# Patient Record
Sex: Female | Born: 1940
Health system: Southern US, Community
[De-identification: ages and names within clinical notes are randomized; demographics above are authoritative.]

## PROBLEM LIST (undated history)

## (undated) DIAGNOSIS — I10 Essential (primary) hypertension: Secondary | ICD-10-CM

## (undated) DIAGNOSIS — I340 Nonrheumatic mitral (valve) insufficiency: Secondary | ICD-10-CM

## (undated) DIAGNOSIS — I509 Heart failure, unspecified: Secondary | ICD-10-CM

## (undated) DIAGNOSIS — I5020 Unspecified systolic (congestive) heart failure: Secondary | ICD-10-CM

## (undated) DIAGNOSIS — N189 Chronic kidney disease, unspecified: Secondary | ICD-10-CM

## (undated) HISTORY — DX: Unspecified systolic (congestive) heart failure: I50.20

## (undated) HISTORY — DX: Chronic kidney disease, unspecified: N18.9

## (undated) HISTORY — DX: Nonrheumatic mitral (valve) insufficiency: I34.0

---

## 2014-06-06 DIAGNOSIS — I429 Cardiomyopathy, unspecified: Secondary | ICD-10-CM | POA: Diagnosis not present

## 2014-06-06 DIAGNOSIS — I341 Nonrheumatic mitral (valve) prolapse: Secondary | ICD-10-CM | POA: Diagnosis not present

## 2014-06-06 DIAGNOSIS — N189 Chronic kidney disease, unspecified: Secondary | ICD-10-CM | POA: Diagnosis not present

## 2014-06-06 DIAGNOSIS — I340 Nonrheumatic mitral (valve) insufficiency: Secondary | ICD-10-CM | POA: Diagnosis not present

## 2014-06-06 DIAGNOSIS — I10 Essential (primary) hypertension: Secondary | ICD-10-CM | POA: Diagnosis not present

## 2014-06-06 DIAGNOSIS — I509 Heart failure, unspecified: Secondary | ICD-10-CM | POA: Diagnosis not present

## 2014-06-09 DIAGNOSIS — R945 Abnormal results of liver function studies: Secondary | ICD-10-CM | POA: Diagnosis not present

## 2014-06-09 DIAGNOSIS — D631 Anemia in chronic kidney disease: Secondary | ICD-10-CM | POA: Diagnosis not present

## 2014-06-09 DIAGNOSIS — I5022 Chronic systolic (congestive) heart failure: Secondary | ICD-10-CM | POA: Diagnosis not present

## 2014-06-09 DIAGNOSIS — I509 Heart failure, unspecified: Secondary | ICD-10-CM | POA: Diagnosis not present

## 2014-06-09 DIAGNOSIS — D500 Iron deficiency anemia secondary to blood loss (chronic): Secondary | ICD-10-CM | POA: Diagnosis not present

## 2014-06-09 DIAGNOSIS — J340 Abscess, furuncle and carbuncle of nose: Secondary | ICD-10-CM | POA: Diagnosis not present

## 2014-06-09 DIAGNOSIS — E878 Other disorders of electrolyte and fluid balance, not elsewhere classified: Secondary | ICD-10-CM | POA: Diagnosis not present

## 2014-07-18 DIAGNOSIS — N189 Chronic kidney disease, unspecified: Secondary | ICD-10-CM | POA: Diagnosis not present

## 2014-07-18 DIAGNOSIS — I429 Cardiomyopathy, unspecified: Secondary | ICD-10-CM | POA: Diagnosis not present

## 2014-07-18 DIAGNOSIS — D649 Anemia, unspecified: Secondary | ICD-10-CM | POA: Diagnosis not present

## 2014-07-18 DIAGNOSIS — I10 Essential (primary) hypertension: Secondary | ICD-10-CM | POA: Diagnosis not present

## 2014-07-18 DIAGNOSIS — I341 Nonrheumatic mitral (valve) prolapse: Secondary | ICD-10-CM | POA: Diagnosis not present

## 2014-07-21 DIAGNOSIS — I340 Nonrheumatic mitral (valve) insufficiency: Secondary | ICD-10-CM | POA: Diagnosis not present

## 2014-07-21 DIAGNOSIS — D6489 Other specified anemias: Secondary | ICD-10-CM | POA: Diagnosis not present

## 2014-07-21 DIAGNOSIS — I5022 Chronic systolic (congestive) heart failure: Secondary | ICD-10-CM | POA: Diagnosis not present

## 2014-07-21 DIAGNOSIS — R7889 Finding of other specified substances, not normally found in blood: Secondary | ICD-10-CM | POA: Diagnosis not present

## 2014-07-21 DIAGNOSIS — N183 Chronic kidney disease, stage 3 (moderate): Secondary | ICD-10-CM | POA: Diagnosis not present

## 2014-07-21 DIAGNOSIS — E46 Unspecified protein-calorie malnutrition: Secondary | ICD-10-CM | POA: Diagnosis not present

## 2014-07-21 DIAGNOSIS — R6889 Other general symptoms and signs: Secondary | ICD-10-CM | POA: Diagnosis not present

## 2014-10-17 DIAGNOSIS — I10 Essential (primary) hypertension: Secondary | ICD-10-CM | POA: Diagnosis not present

## 2014-10-17 DIAGNOSIS — I429 Cardiomyopathy, unspecified: Secondary | ICD-10-CM | POA: Diagnosis not present

## 2014-10-17 DIAGNOSIS — N189 Chronic kidney disease, unspecified: Secondary | ICD-10-CM | POA: Diagnosis not present

## 2014-10-17 DIAGNOSIS — I341 Nonrheumatic mitral (valve) prolapse: Secondary | ICD-10-CM | POA: Diagnosis not present

## 2014-10-27 DIAGNOSIS — I5022 Chronic systolic (congestive) heart failure: Secondary | ICD-10-CM | POA: Diagnosis not present

## 2014-10-27 DIAGNOSIS — E878 Other disorders of electrolyte and fluid balance, not elsewhere classified: Secondary | ICD-10-CM | POA: Diagnosis not present

## 2014-10-27 DIAGNOSIS — D6489 Other specified anemias: Secondary | ICD-10-CM | POA: Diagnosis not present

## 2014-10-27 DIAGNOSIS — I10 Essential (primary) hypertension: Secondary | ICD-10-CM | POA: Diagnosis not present

## 2014-10-27 DIAGNOSIS — D649 Anemia, unspecified: Secondary | ICD-10-CM | POA: Diagnosis not present

## 2014-10-27 DIAGNOSIS — E079 Disorder of thyroid, unspecified: Secondary | ICD-10-CM | POA: Diagnosis not present

## 2014-10-27 DIAGNOSIS — E785 Hyperlipidemia, unspecified: Secondary | ICD-10-CM | POA: Diagnosis not present

## 2014-10-27 DIAGNOSIS — I340 Nonrheumatic mitral (valve) insufficiency: Secondary | ICD-10-CM | POA: Diagnosis not present

## 2014-10-27 DIAGNOSIS — R945 Abnormal results of liver function studies: Secondary | ICD-10-CM | POA: Diagnosis not present

## 2014-11-29 DIAGNOSIS — I340 Nonrheumatic mitral (valve) insufficiency: Secondary | ICD-10-CM | POA: Diagnosis not present

## 2014-11-29 DIAGNOSIS — R945 Abnormal results of liver function studies: Secondary | ICD-10-CM | POA: Diagnosis not present

## 2014-11-29 DIAGNOSIS — D649 Anemia, unspecified: Secondary | ICD-10-CM | POA: Diagnosis not present

## 2014-11-29 DIAGNOSIS — I5022 Chronic systolic (congestive) heart failure: Secondary | ICD-10-CM | POA: Diagnosis not present

## 2015-10-20 DIAGNOSIS — I509 Heart failure, unspecified: Secondary | ICD-10-CM | POA: Diagnosis not present

## 2015-10-20 DIAGNOSIS — I10 Essential (primary) hypertension: Secondary | ICD-10-CM | POA: Diagnosis not present

## 2015-10-20 DIAGNOSIS — R011 Cardiac murmur, unspecified: Secondary | ICD-10-CM | POA: Diagnosis not present

## 2015-10-22 ENCOUNTER — Emergency Department (HOSPITAL_COMMUNITY)
Admission: EM | Admit: 2015-10-22 | Discharge: 2015-10-22 | Disposition: A | Discharge status: Home / Self Care | Payer: Medicare Other | Attending: Emergency Medicine | Admitting: Emergency Medicine

## 2015-10-22 ENCOUNTER — Encounter (HOSPITAL_COMMUNITY): Payer: Self-pay | Admitting: Emergency Medicine

## 2015-10-22 DIAGNOSIS — I509 Heart failure, unspecified: Secondary | ICD-10-CM | POA: Insufficient documentation

## 2015-10-22 DIAGNOSIS — N189 Chronic kidney disease, unspecified: Secondary | ICD-10-CM | POA: Diagnosis not present

## 2015-10-22 DIAGNOSIS — I130 Hypertensive heart and chronic kidney disease with heart failure and stage 1 through stage 4 chronic kidney disease, or unspecified chronic kidney disease: Secondary | ICD-10-CM | POA: Insufficient documentation

## 2015-10-22 DIAGNOSIS — R944 Abnormal results of kidney function studies: Secondary | ICD-10-CM | POA: Diagnosis present

## 2015-10-22 DIAGNOSIS — Z79899 Other long term (current) drug therapy: Secondary | ICD-10-CM | POA: Insufficient documentation

## 2015-10-22 DIAGNOSIS — I129 Hypertensive chronic kidney disease with stage 1 through stage 4 chronic kidney disease, or unspecified chronic kidney disease: Secondary | ICD-10-CM | POA: Diagnosis not present

## 2015-10-22 HISTORY — DX: Essential (primary) hypertension: I10

## 2015-10-22 HISTORY — DX: Heart failure, unspecified: I50.9

## 2015-10-22 LAB — BASIC METABOLIC PANEL
ANION GAP: 8 (ref 5–15)
BUN: 69 mg/dL — AB (ref 6–20)
CALCIUM: 9.4 mg/dL (ref 8.9–10.3)
CO2: 23 mmol/L (ref 22–32)
Chloride: 107 mmol/L (ref 101–111)
Creatinine, Ser: 2.95 mg/dL — ABNORMAL HIGH (ref 0.44–1.00)
GFR calc Af Amer: 17 mL/min — ABNORMAL LOW (ref >60)
GFR calc non Af Amer: 14 mL/min — ABNORMAL LOW (ref >60)
Glucose, Bld: 101 mg/dL — ABNORMAL HIGH (ref 65–99)
POTASSIUM: 4.9 mmol/L (ref 3.5–5.1)
SODIUM: 138 mmol/L (ref 135–145)

## 2015-10-22 LAB — CBC WITH DIFFERENTIAL/PLATELET
BASOS ABS: 0 10*3/uL (ref 0.0–0.1)
Basophils Relative: 1 %
EOS ABS: 0.1 10*3/uL (ref 0.0–0.7)
EOS PCT: 3 %
HCT: 26.7 % — ABNORMAL LOW (ref 36.0–46.0)
Hemoglobin: 8.7 g/dL — ABNORMAL LOW (ref 12.0–15.0)
Lymphocytes Relative: 41 %
Lymphs Abs: 2.2 10*3/uL (ref 0.7–4.0)
MCH: 28.6 pg (ref 26.0–34.0)
MCHC: 32.6 g/dL (ref 30.0–36.0)
MCV: 87.8 fL (ref 78.0–100.0)
Monocytes Absolute: 0.3 10*3/uL (ref 0.1–1.0)
Monocytes Relative: 5 %
Neutro Abs: 2.7 10*3/uL (ref 1.7–7.7)
Neutrophils Relative %: 50 %
PLATELETS: 181 10*3/uL (ref 150–400)
RBC: 3.04 MIL/uL — AB (ref 3.87–5.11)
RDW: 14.4 % (ref 11.5–15.5)
WBC: 5.3 10*3/uL (ref 4.0–10.5)

## 2015-10-22 LAB — I-STAT CHEM 8, ED
BUN: 66 mg/dL — ABNORMAL HIGH (ref 6–20)
CALCIUM ION: 1.17 mmol/L (ref 1.13–1.30)
CHLORIDE: 105 mmol/L (ref 101–111)
Creatinine, Ser: 3 mg/dL — ABNORMAL HIGH (ref 0.44–1.00)
Glucose, Bld: 100 mg/dL — ABNORMAL HIGH (ref 65–99)
HCT: 28 % — ABNORMAL LOW (ref 36.0–46.0)
HEMOGLOBIN: 9.5 g/dL — AB (ref 12.0–15.0)
Potassium: 4.8 mmol/L (ref 3.5–5.1)
SODIUM: 140 mmol/L (ref 135–145)
TCO2: 21 mmol/L (ref 0–100)

## 2015-10-22 NOTE — ED Notes (Signed)
Pt states that she went to the MD on Friday and had lab work drawn. Labs came back today and stated that her creatinine and potassium were high today and she needed to come in for evaluation. Alert and oriented.

## 2015-10-22 NOTE — Discharge Instructions (Signed)
Chronic Kidney Disease Chronic kidney disease occurs when the kidneys are damaged over a long period. The kidneys are two organs that lie on either side of the spine between the middle of the back and the front of the abdomen. The kidneys:  Remove wastes and extra water from the blood.  Produce important hormones. These help keep bones strong, regulate blood pressure, and help create red blood cells.  Balance the fluids and chemicals in the blood and tissues. A small amount of kidney damage may not cause problems, but a large amount of damage may make it difficult or impossible for the kidneys to work the way they should. If steps are not taken to slow down the kidney damage or stop it from getting worse, the kidneys may stop working permanently. Most of the time, chronic kidney disease does not go away. However, it can often be controlled, and those with the disease can usually live normal lives. CAUSES The most common causes of chronic kidney disease are diabetes and high blood pressure (hypertension). Chronic kidney disease may also be caused by:  Diseases that cause the kidneys' filters to become inflamed.  Diseases that affect the immune system.  Genetic diseases.  Medicines that damage the kidneys, such as anti-inflammatory medicines.  Poisoning or exposure to toxic substances.  A reoccurring kidney or urinary infection.  A problem with urine flow. This may be caused by:  Cancer.  Kidney stones.  An enlarged prostate in males. SIGNS AND SYMPTOMS Because the kidney damage in chronic kidney disease occurs slowly, symptoms develop slowly and may not be obvious until the kidney damage becomes severe. A person may have a kidney disease for years without showing any symptoms. Symptoms can include:  Swelling (edema) of the legs, ankles, or feet.  Tiredness (lethargy).  Nausea or vomiting.  Confusion.  Problems with urination, such as:  Decreased urine  production.  Frequent urination, especially at night.  Frequent accidents in children who are potty trained.  Muscle twitches and cramps.  Shortness of breath.  Weakness.  Persistent itchiness.  Loss of appetite.  Metallic taste in the mouth.  Trouble sleeping.  Slowed development in children.  Short stature in children. DIAGNOSIS Chronic kidney disease may be detected and diagnosed by tests, including blood, urine, imaging, or kidney biopsy tests. TREATMENT Most chronic kidney diseases cannot be cured. Treatment usually involves relieving symptoms and preventing or slowing the progression of the disease. Treatment may include:  A special diet. You may need to avoid alcohol and foods thatare salty and high in potassium.  Medicines. These may:  Lower blood pressure.  Relieve anemia.  Relieve swelling.  Protect the bones. HOME CARE INSTRUCTIONS  Follow your prescribed diet. Your health care provider may instruct you to limit daily salt (sodium) and protein intake.  Take medicines only as directed by your health care provider. Do not take any new medicines (prescription, over-the-counter, or nutritional supplements) unless approved by your health care provider. Many medicines can worsen your kidney damage or need to have the dose adjusted.   Quit smoking if you smoke. Talk to your health care provider about a smoking cessation program.  Keep all follow-up visits as directed by your health care provider.  Monitor your blood pressure.  Start or continue an exercise plan.  Get immunizations as directed by your health care provider.  Take vitamin and mineral supplements as directed by your health care provider. SEEK IMMEDIATE MEDICAL CARE IF:  Your symptoms get worse or you develop  new symptoms.  You develop symptoms of end-stage kidney disease. These include:  Headaches.  Abnormally dark or light skin.  Numbness in the hands or feet.  Easy  bruising.  Frequent hiccups.  Menstruation stops.  You have a fever.  You have decreased urine production.  You havepain or bleeding when urinating. MAKE SURE YOU:  Understand these instructions.  Will watch your condition.  Will get help right away if you are not doing well or get worse. FOR MORE INFORMATION   American Association of Kidney Patients: BombTimer.gl  National Kidney Foundation: www.kidney.[place]: https://mathis.com/  [facility]: www.lifeoptions.org and www.kidneyschool.org   This information is not intended to replace advice given to you by your health care provider. Make sure you discuss any questions you have with your health care provider.   Document Released: 02/20/2008 Document Revised: 06/03/2014 Document Reviewed: 01/10/2012 Elsevier Interactive Patient Education 2016 [place] for Chronic Kidney Disease When your kidneys are not working well, they cannot remove waste and excess substances from your blood as effectively as they did before. This can lead to a buildup and imbalance of these substances, which can affect how your body functions. This buildup can also make your kidneys work harder, causing even more damage. You may need to eat less of certain foods that can lead to the buildup of these substances in your body. By making the changes to your diet that are recommended by your dietitian or health care provider, you could possibly help prevent further kidney damage and delay or prevent the need for dialysis. The following information can help give you a basic understanding of these substances and how they affect your bodily functions. The information also gives examples of foods that contain the highest amounts of these substances. WHAT DO I NEED TO KNOW ABOUT SUBSTANCES IN MY FOOD THAT I MAY NEED TO ADJUST? Food adjustments will be different for each person with chronic kidney disease. It is  important that you see a dietitian who can help you determine the specific adjustments that you will need to make for each of the following substances: Potassium Potassium affects how steadily your heart beats. If too much potassium builds up in your blood, it can cause an irregular heartbeat or even a heart attack. Examples of foods rich in potassium include:  Milk.  Fruits.  Vegetables. Phosphorus Phosphorus is a mineral found in your bones. A balance between calcium and phosphorous is needed to build and maintain healthy bones. Too much phosphorus pulls calcium from your bones. This can make your bones weak and more likely to break. Too much phosphorus can also make your skin itch. Examples of foods rich in phosphorus include:  Milk and cheese.  Dried beans.  Peas.  Colas.  Nuts and peanut butter. Animal Protein Animal protein helps you make and keep muscle. It also helps in the repair of your body's cells and tissues. One of the natural breakdown products of protein is a waste product called urea. When your kidneys are not working properly, they cannot remove wastes such as urea like they did before you developed chronic kidney disease. You will likely need to limit the amount of protein you eat to help prevent a buildup of urea in your blood. Examples of animal protein include:  Meat (all types).  Fish and seafood.  Poultry.  Eggs. Sodium Sodium, which is found in salt, helps maintain a healthy balance of fluids in your body. Too much  sodium can increase your blood pressure level and have a negative affect on the function of your heart and lungs. Too much sodium also can cause your body to retain too much fluid, making your kidneys work harder. Examples of foods with high levels of sodium include:  Salt seasonings.  Soy sauce.  Cured and processed meats.  Salted crackers and snack foods.  Fast food.  Canned soups and most canned foods. Glucose Glucose provides  energy for your body. If you have diabetes mellitus that is not properly controlled, you have too much glucose in your blood. Too much glucose in your blood can worsen the function of your kidneys by damaging small blood vessels. This prevents enough blood flow to your kidneys to give them what they need to work. If you have diabetes mellitus and chronic kidney disease, it is important to maintain your blood glucose at a level recommended by your health care provider. SHOULD I TAKE A VITAMIN AND MINERAL SUPPLEMENT? Because you may need to avoid eating certain foods, you may not get all of the vitamins and minerals that would normally come from those foods. Your health care provider or dietitian may recommend that you take a supplement to ensure that you get all of the vitamins and minerals that your body needs.    This information is not intended to replace advice given to you by your health care provider. Make sure you discuss any questions you have with your health care provider.   Document Released: 08/03/2002 Document Revised: 06/03/2014 Document Reviewed: 04/09/2013 Elsevier Interactive Patient Education [employer].

## 2015-10-22 NOTE — ED Provider Notes (Signed)
CSN: [id number]     Arrival date & time 10/22/15  2120 History   First MD Initiated Contact with Patient 10/22/15 2142     Chief Complaint  Patient presents with  . Abnormal Lab      HPI Pt states that she went to the MD on Friday and had lab work drawn. Labs came back today and stated that her creatinine and potassium were high today and she needed to come in for evaluation. Alert and oriented. Past Medical History  Diagnosis Date  . Hypertension   . CHF (congestive heart failure) ([facility])    History reviewed. No pertinent past surgical history. No family history on file. Social History  Substance Use Topics  . Smoking status: Never Smoker   . Smokeless tobacco: None  . Alcohol Use: No   OB History    No data available     Review of Systems  All other systems reviewed and are negative.     Allergies  Penicillins  Home Medications   Prior to Admission medications   Medication Sig Start Date End Date Taking? Authorizing Provider  carvedilol (COREG) 25 MG tablet Take 25 mg by mouth 2 (two) times daily. 10/07/15  Yes Historical Provider, MD  ramipril (ALTACE) 10 MG capsule Take 10 mg by mouth daily. 10/07/15  Yes Historical Provider, MD  spironolactone-hydrochlorothiazide (ALDACTAZIDE) 25-25 MG tablet Take 1 tablet by mouth See admin instructions. Take 1 tablet by mouth on Monday, Wednesday, and Friday. 10/07/15  Yes Historical Provider, MD   BP 189/82 mmHg  Pulse 55  Temp(Src) 98.1 F (36.7 C) (Oral)  Resp 18  SpO2 97% Physical Exam  Constitutional: She is oriented to person, place, and time. She appears well-developed and well-nourished. No distress.  HENT:  Head: Normocephalic and atraumatic.  Eyes: Pupils are equal, round, and reactive to light.  Neck: Normal range of motion.  Cardiovascular: Normal rate and intact distal pulses.   Pulmonary/Chest: No respiratory distress.  Abdominal: Normal appearance. She exhibits no distension.  Musculoskeletal: Normal range  of motion.  Neurological: She is alert and oriented to person, place, and time. No cranial nerve deficit.  Skin: Skin is warm and dry. No rash noted.  Psychiatric: She has a normal mood and affect. Her behavior is normal.  Nursing note and vitals reviewed.   ED Course  Procedures (including critical care time) Labs Review Labs Reviewed  CBC WITH DIFFERENTIAL/PLATELET - Abnormal; Notable for the following:    RBC 3.04 (*)    Hemoglobin 8.7 (*)    HCT 26.7 (*)    All other components within normal limits  BASIC METABOLIC PANEL - Abnormal; Notable for the following:    Glucose, Bld 101 (*)    BUN 69 (*)    Creatinine, Ser 2.95 (*)    GFR calc non Af Amer 14 (*)    GFR calc Af Amer 17 (*)    All other components within normal limits  I-STAT CHEM 8, ED - Abnormal; Notable for the following:    BUN 66 (*)    Creatinine, Ser 3.00 (*)    Glucose, Bld 100 (*)    Hemoglobin 9.5 (*)    HCT 28.0 (*)    All other components within normal limits        MDM   Final diagnoses:  Chronic renal failure, unspecified stage        Leonard Schwartz, MD 10/22/15 2318

## 2015-11-03 ENCOUNTER — Ambulatory Visit: Payer: Medicare Other | Admitting: Internal Medicine

## 2015-11-10 DIAGNOSIS — D649 Anemia, unspecified: Secondary | ICD-10-CM | POA: Diagnosis not present

## 2015-11-10 DIAGNOSIS — N189 Chronic kidney disease, unspecified: Secondary | ICD-10-CM | POA: Diagnosis not present

## 2015-12-01 DIAGNOSIS — Z0000 Encounter for general adult medical examination without abnormal findings: Secondary | ICD-10-CM | POA: Diagnosis not present

## 2015-12-01 DIAGNOSIS — D539 Nutritional anemia, unspecified: Secondary | ICD-10-CM | POA: Diagnosis not present

## 2015-12-01 DIAGNOSIS — N189 Chronic kidney disease, unspecified: Secondary | ICD-10-CM | POA: Diagnosis not present

## 2015-12-01 DIAGNOSIS — D649 Anemia, unspecified: Secondary | ICD-10-CM | POA: Diagnosis not present

## 2015-12-01 DIAGNOSIS — I10 Essential (primary) hypertension: Secondary | ICD-10-CM | POA: Diagnosis not present

## 2015-12-04 ENCOUNTER — Emergency Department (HOSPITAL_COMMUNITY)
Admission: EM | Admit: 2015-12-04 | Discharge: 2015-12-05 | Disposition: A | Discharge status: Home / Self Care | Payer: Medicare Other | Attending: Dermatology | Admitting: Dermatology

## 2015-12-04 ENCOUNTER — Encounter (HOSPITAL_COMMUNITY): Payer: Self-pay

## 2015-12-04 DIAGNOSIS — Z5321 Procedure and treatment not carried out due to patient leaving prior to being seen by health care provider: Secondary | ICD-10-CM | POA: Diagnosis not present

## 2015-12-04 DIAGNOSIS — E875 Hyperkalemia: Secondary | ICD-10-CM | POA: Diagnosis not present

## 2015-12-04 DIAGNOSIS — R944 Abnormal results of kidney function studies: Secondary | ICD-10-CM | POA: Diagnosis not present

## 2015-12-04 NOTE — ED Notes (Signed)
Pt sent here from [facility] for further testing of her Kidney function

## 2015-12-05 NOTE — ED Notes (Signed)
Pt called from triage, no answer 

## 2015-12-13 DIAGNOSIS — I129 Hypertensive chronic kidney disease with stage 1 through stage 4 chronic kidney disease, or unspecified chronic kidney disease: Secondary | ICD-10-CM | POA: Diagnosis not present

## 2015-12-13 DIAGNOSIS — N2581 Secondary hyperparathyroidism of renal origin: Secondary | ICD-10-CM | POA: Diagnosis not present

## 2015-12-13 DIAGNOSIS — D631 Anemia in chronic kidney disease: Secondary | ICD-10-CM | POA: Diagnosis not present

## 2015-12-13 DIAGNOSIS — N189 Chronic kidney disease, unspecified: Secondary | ICD-10-CM | POA: Diagnosis not present

## 2015-12-15 ENCOUNTER — Ambulatory Visit (INDEPENDENT_AMBULATORY_CARE_PROVIDER_SITE_OTHER): Payer: Medicare Other | Admitting: Cardiovascular Disease

## 2015-12-15 ENCOUNTER — Encounter: Payer: Self-pay | Admitting: Cardiovascular Disease

## 2015-12-15 VITALS — BP 124/78 | HR 66 | Ht 67.0 in | Wt 152.0 lb

## 2015-12-15 DIAGNOSIS — I5022 Chronic systolic (congestive) heart failure: Secondary | ICD-10-CM | POA: Insufficient documentation

## 2015-12-15 DIAGNOSIS — I059 Rheumatic mitral valve disease, unspecified: Secondary | ICD-10-CM

## 2015-12-15 DIAGNOSIS — I340 Nonrheumatic mitral (valve) insufficiency: Secondary | ICD-10-CM | POA: Insufficient documentation

## 2015-12-15 DIAGNOSIS — I5032 Chronic diastolic (congestive) heart failure: Secondary | ICD-10-CM | POA: Insufficient documentation

## 2015-12-15 NOTE — Progress Notes (Signed)
Cardiology Office Note   Date:  12/15/2015   ID:  Julia [name], DOB [birth year], MRN [id number]  PCP:  London Pepper, MD  Cardiologist:   Mertie Moores, MD   No chief complaint on file.  Problem List:  1. Essential HTN 2. Chronic systolic CHF  - EF [id number] 3. Chronic renal disease - stage III 4. Severe MR by TEE    History of Present Illness:  Seen with granddaughter, Julia [name] is a 75 y.o. female who presents for eval of CHF and severe MR .  Recently moved from [place].   She's been treated with medicines and feels quite a bit better. She's not had any surgical repair.     No acute issues  Potassium was elevated recenly and her Aldactone , Ramipril were stopped.  Fairly active for 74 years.  Does her own shopping and housework .   Sleeps with 2 pillows.   Not particularly short of breat with 1 pillow  Had significant CHF in 2015 while in [place] ( no dyspnea, just leg edema )  She originally presented with congestive heart failure. Evaluation revealed that she had normal coronary arteries. She was found have severe mitral regurgitation due to a flail leaflet of the mitral valve. She was also found to have anemia in chronic kidney disease.   Past Medical History  Diagnosis Date  . Hypertension   . CHF (congestive heart failure) ([facility])     No past surgical history on file.   Current Outpatient Prescriptions  Medication Sig Dispense Refill  . carvedilol (COREG) 25 MG tablet Take 25 mg by mouth 2 (two) times daily.  3   No current facility-administered medications for this visit.    Allergies:   Penicillins    Social History:  The patient  reports that she has quit smoking. She has never used smokeless tobacco. She reports that she does not drink alcohol or use illicit drugs.   Family History:  The patient's family history includes Alcohol abuse in her brother; Breast cancer in her sister; Cancer in her mother; Congestive Heart Failure in her sister;  Diabetes in her sister; Diverticulitis in her brother; Gout in her sister; Healthy in her brother, brother, and daughter; Hypertension in her father and sister; Kidney disease in her sister; Lung disease in her sister; Stroke in her father.    ROS:  Please see the history of present illness.    Review of Systems: Constitutional:  denies fever, chills, diaphoresis, appetite change and fatigue.  HEENT: denies photophobia, eye pain, redness, hearing loss, ear pain, congestion, sore throat, rhinorrhea, sneezing, neck pain, neck stiffness and tinnitus.  Respiratory: denies SOB, DOE, cough, chest tightness, and wheezing.  Cardiovascular: denies chest pain, palpitations and leg swelling.  Gastrointestinal: denies nausea, vomiting, abdominal pain, diarrhea, constipation, blood in stool.  Genitourinary: denies dysuria, urgency, frequency, hematuria, flank pain and difficulty urinating.  Musculoskeletal: denies  myalgias, back pain, joint swelling, arthralgias and gait problem.   Skin: denies pallor, rash and wound.  Neurological: denies dizziness, seizures, syncope, weakness, light-headedness, numbness and headaches.   Hematological: denies adenopathy, easy bruising, personal or family bleeding history.  Psychiatric/ Behavioral: denies suicidal ideation, mood changes, confusion, nervousness, sleep disturbance and agitation.       All other systems are reviewed and negative.    PHYSICAL EXAM: VS:  BP 124/78 mmHg  Pulse 66  Ht 5\' 7"  (1.702 m)  Wt 152 lb (68.947 kg)  BMI 23.80 kg/m2  SpO2 99% , BMI Body mass index is 23.8 kg/(m^2). GEN: Well nourished, well developed, in no acute distress HEENT: normal Neck: no JVD, carotid bruits, or masses Cardiac: RRR; [id number] systolic murmur radiating to the axilla , no  rubs, or gallops,no edema  Respiratory:  clear to auscultation bilaterally, normal work of breathing GI: soft, nontender, nondistended, + BS MS: no deformity or atrophy Skin: warm and  dry, no rash Neuro:  Strength and sensation are intact Psych: normal  EKG:  EKG is ordered today. The ekg ordered today demonstrates  NSR at 66.   RBBB    Recent Labs: 10/22/2015: BUN 66*; Creatinine, Ser 3.00*; Hemoglobin 9.5*; Platelets 181; Potassium 4.8; Sodium 140    Lipid Panel No results found for: CHOL, TRIG, HDL, CHOLHDL, VLDL, LDLCALC, LDLDIRECT    Wt Readings from Last 3 Encounters:  12/15/15 152 lb (68.947 kg)  12/04/15 155 lb (70.308 kg)      Other studies Reviewed: Additional studies/ records that were reviewed today include: . Review of the above records demonstrates:    ASSESSMENT AND PLAN:  1.  Chronic systolic congestive heart failure: The patient has severe mitral regurgitation. She has normal coronary arteries by heart catheterization in 2015. Transesophageal echo at that time revealed a flail mitral valve leaflet. She was thought to be very late in her presentation with her severe mitral regurgitation and she was treated medically. She was thought to be too high a risk for surgical repair or replacement of the mitral valve.  Complicating that is that her creatinine was in the 2.5  to 3 range.  4 showing, she's done very well and has not had any recent chest pains or shortness of breath. Her symptoms have improved. She really never had any shortness of breath but had leg edema. She was previously on Aldactone and Ramipril  but these have been discontinued because of deterioration of her renal function.  After long discussion with her and her granddaughter. They understand that surgery is not an option. We'll continue with medical therapy. She's currently on carvedilol and seems to be doing very well. If she develops shortness of breath will need to consider Lasix or torsemide. Her renal function will be followed fairly closely and she has been seen by Dr. Justin Mend at [facility].  We will get an echocardiogram for further assessment of her left  ventricular function and mitral valve function.  I'll see her in 6 months.   Current medicines are reviewed at length with the patient today.  The patient does not have concerns regarding medicines.  Labs/ tests ordered today include:   Orders Placed This Encounter  Procedures  . EKG 12-Lead  . ECHOCARDIOGRAM COMPLETE     Disposition:   FU with me in 6 months      Mertie Moores, MD  12/15/2015 4:42 PM    [facility] [place], [place], [place]  [address] Phone: [phone]; Fax: [phone]   [facility]  [address] [place],   [address] [phone]   Fax [phone]

## 2015-12-15 NOTE — Patient Instructions (Signed)
Medication Instructions:  Your physician recommends that you continue on your current medications as directed. Please refer to the Current Medication list given to you today.   Labwork: None Ordered   Testing/Procedures: Your physician has requested that you have an echocardiogram. Echocardiography is a painless test that uses sound waves to create images of your heart. It provides your doctor with information about the size and shape of your heart and how well your heart's chambers and valves are working. This procedure takes approximately one hour. There are no restrictions for this procedure.   Follow-Up Your physician wants you to follow-up in: 6 months with Dr. Nahser.  You will receive a reminder letter in the mail two months in advance. If you don't receive a letter, please call our office to schedule the follow-up appointment.   If you need a refill on your cardiac medications before your next appointment, please call your pharmacy.   Thank you for choosing [facility]! Michelle Swinyer, RN [phone]    

## 2015-12-16 ENCOUNTER — Other Ambulatory Visit: Payer: Self-pay | Admitting: Nephrology

## 2015-12-16 DIAGNOSIS — N189 Chronic kidney disease, unspecified: Secondary | ICD-10-CM

## 2015-12-22 ENCOUNTER — Ambulatory Visit
Admission: RE | Admit: 2015-12-22 | Discharge: 2015-12-22 | Disposition: A | Discharge status: Home / Self Care | Payer: Medicare Other | Source: Ambulatory Visit | Attending: Nephrology | Admitting: Nephrology

## 2015-12-22 DIAGNOSIS — N189 Chronic kidney disease, unspecified: Secondary | ICD-10-CM

## 2015-12-25 ENCOUNTER — Encounter (HOSPITAL_COMMUNITY): Payer: Medicare Other

## 2015-12-28 ENCOUNTER — Ambulatory Visit (HOSPITAL_COMMUNITY): Payer: Medicare Other | Attending: Cardiovascular Disease

## 2015-12-28 ENCOUNTER — Other Ambulatory Visit (HOSPITAL_COMMUNITY): Payer: Self-pay | Admitting: *Deleted

## 2015-12-28 ENCOUNTER — Other Ambulatory Visit: Payer: Self-pay

## 2015-12-28 ENCOUNTER — Encounter (INDEPENDENT_AMBULATORY_CARE_PROVIDER_SITE_OTHER): Payer: Self-pay

## 2015-12-28 DIAGNOSIS — I341 Nonrheumatic mitral (valve) prolapse: Secondary | ICD-10-CM | POA: Insufficient documentation

## 2015-12-28 DIAGNOSIS — I059 Rheumatic mitral valve disease, unspecified: Secondary | ICD-10-CM | POA: Diagnosis not present

## 2015-12-28 DIAGNOSIS — I5022 Chronic systolic (congestive) heart failure: Secondary | ICD-10-CM | POA: Diagnosis not present

## 2015-12-28 DIAGNOSIS — I358 Other nonrheumatic aortic valve disorders: Secondary | ICD-10-CM | POA: Insufficient documentation

## 2015-12-28 DIAGNOSIS — I340 Nonrheumatic mitral (valve) insufficiency: Secondary | ICD-10-CM | POA: Diagnosis not present

## 2015-12-28 DIAGNOSIS — I509 Heart failure, unspecified: Secondary | ICD-10-CM | POA: Diagnosis present

## 2015-12-28 DIAGNOSIS — I110 Hypertensive heart disease with heart failure: Secondary | ICD-10-CM | POA: Insufficient documentation

## 2015-12-29 ENCOUNTER — Encounter (HOSPITAL_COMMUNITY)
Admission: RE | Admit: 2015-12-29 | Discharge: 2015-12-29 | Disposition: A | Discharge status: Home / Self Care | Payer: Medicare Other | Source: Ambulatory Visit | Attending: Nephrology | Admitting: Nephrology

## 2015-12-29 ENCOUNTER — Other Ambulatory Visit (HOSPITAL_COMMUNITY): Payer: Self-pay | Admitting: *Deleted

## 2015-12-29 DIAGNOSIS — Z5181 Encounter for therapeutic drug level monitoring: Secondary | ICD-10-CM | POA: Diagnosis not present

## 2015-12-29 DIAGNOSIS — N183 Chronic kidney disease, stage 3 (moderate): Secondary | ICD-10-CM | POA: Insufficient documentation

## 2015-12-29 DIAGNOSIS — D631 Anemia in chronic kidney disease: Secondary | ICD-10-CM | POA: Diagnosis not present

## 2015-12-29 DIAGNOSIS — Z79899 Other long term (current) drug therapy: Secondary | ICD-10-CM | POA: Diagnosis not present

## 2015-12-29 LAB — POCT HEMOGLOBIN-HEMACUE: HEMOGLOBIN: 8.8 g/dL — AB (ref 12.0–15.0)

## 2015-12-29 MED ORDER — EPOETIN ALFA 10000 UNIT/ML IJ SOLN
INTRAMUSCULAR | Status: DC
Start: 2015-12-29 — End: 2015-12-30
  Filled 2015-12-29: qty 1

## 2015-12-29 MED ORDER — EPOETIN ALFA 10000 UNIT/ML IJ SOLN
5000.0000 [IU] | Freq: Once | INTRAMUSCULAR | Status: AC
  Administered 2015-12-29: 5000 [IU] via SUBCUTANEOUS

## 2015-12-29 NOTE — Discharge Instructions (Signed)
Epoetin Alfa injection What is this medicine? EPOETIN ALFA (e POE e tin AL fa) helps your body make more red blood cells. This medicine is used to treat anemia caused by chronic kidney failure, cancer chemotherapy, or HIV-therapy. It may also be used before surgery if you have anemia. This medicine may be used for other purposes; ask your health care provider or pharmacist if you have questions. What should I tell my health care provider before I take this medicine? They need to know if you have any of these conditions: -blood clotting disorders -cancer patient not on chemotherapy -cystic fibrosis -heart disease, such as angina or heart failure -hemoglobin level of 12 g/dL or greater -high blood pressure -low levels of folate, iron, or vitamin B12 -seizures -an unusual or allergic reaction to erythropoietin, albumin, benzyl alcohol, hamster proteins, other medicines, foods, dyes, or preservatives -pregnant or trying to get pregnant -breast-feeding How should I use this medicine? This medicine is for injection into a vein or under the skin. It is usually given by a health care professional in a hospital or clinic setting. If you get this medicine at home, you will be taught how to prepare and give this medicine. Use exactly as directed. Take your medicine at regular intervals. Do not take your medicine more often than directed. It is important that you put your used needles and syringes in a special sharps container. Do not put them in a trash can. If you do not have a sharps container, call your pharmacist or healthcare provider to get one. Talk to your pediatrician regarding the use of this medicine in children. While this drug may be prescribed for selected conditions, precautions do apply. Overdosage: If you think you have taken too much of this medicine contact a poison control center or emergency room at once. NOTE: This medicine is only for you. Do not share this medicine with  others. What if I miss a dose? If you miss a dose, take it as soon as you can. If it is almost time for your next dose, take only that dose. Do not take double or extra doses. What may interact with this medicine? Do not take this medicine with any of the following medications: -darbepoetin alfa This list may not describe all possible interactions. Give your health care provider a list of all the medicines, herbs, non-prescription drugs, or dietary supplements you use. Also tell them if you smoke, drink alcohol, or use illegal drugs. Some items may interact with your medicine. What should I watch for while using this medicine? Visit your prescriber or health care professional for regular checks on your progress and for the needed blood tests and blood pressure measurements. It is especially important for the doctor to make sure your hemoglobin level is in the desired range, to limit the risk of potential side effects and to give you the best benefit. Keep all appointments for any recommended tests. Check your blood pressure as directed. Ask your doctor what your blood pressure should be and when you should contact him or her. As your body makes more red blood cells, you may need to take iron, folic acid, or vitamin B supplements. Ask your doctor or health care provider which products are right for you. If you have kidney disease continue dietary restrictions, even though this medication can make you feel better. Talk with your doctor or health care professional about the foods you eat and the vitamins that you take. What side effects may I notice   from receiving this medicine? Side effects that you should report to your doctor or health care professional as soon as possible: -allergic reactions like skin rash, itching or hives, swelling of the face, lips, or tongue -breathing problems -changes in vision -chest pain -confusion, trouble speaking or understanding -feeling faint or lightheaded,  falls -high blood pressure -muscle aches or pains -pain, swelling, warmth in the leg -rapid weight gain -severe headaches -sudden numbness or weakness of the face, arm or leg -trouble walking, dizziness, loss of balance or coordination -seizures (convulsions) -swelling of the ankles, feet, hands -unusually weak or tired Side effects that usually do not require medical attention (report to your doctor or health care professional if they continue or are bothersome): -diarrhea -fever, chills (flu-like symptoms) -headaches -nausea, vomiting -redness, stinging, or swelling at site where injected This list may not describe all possible side effects. Call your doctor for medical advice about side effects. You may report side effects to FDA at 1-800-FDA-1088. Where should I keep my medicine? Keep out of the reach of children. Store in a refrigerator between 2 and 8 degrees C (36 and 46 degrees F). Do not freeze or shake. Throw away any unused portion if using a single-dose vial. Multi-dose vials can be kept in the refrigerator for up to 21 days after the initial dose. Throw away unused medicine. NOTE: This sheet is a summary. It may not cover all possible information. If you have questions about this medicine, talk to your doctor, pharmacist, or health care provider.    2016, Elsevier/Gold Standard. (2008-04-26 10:25:44)  

## 2016-01-05 ENCOUNTER — Encounter (HOSPITAL_COMMUNITY): Payer: Medicare Other

## 2016-01-05 DIAGNOSIS — N2581 Secondary hyperparathyroidism of renal origin: Secondary | ICD-10-CM | POA: Diagnosis not present

## 2016-01-05 DIAGNOSIS — D509 Iron deficiency anemia, unspecified: Secondary | ICD-10-CM | POA: Diagnosis not present

## 2016-01-05 DIAGNOSIS — I129 Hypertensive chronic kidney disease with stage 1 through stage 4 chronic kidney disease, or unspecified chronic kidney disease: Secondary | ICD-10-CM | POA: Diagnosis not present

## 2016-01-05 DIAGNOSIS — N189 Chronic kidney disease, unspecified: Secondary | ICD-10-CM | POA: Diagnosis not present

## 2016-01-08 ENCOUNTER — Encounter: Payer: Self-pay | Admitting: Nephrology

## 2016-01-08 DIAGNOSIS — D638 Anemia in other chronic diseases classified elsewhere: Secondary | ICD-10-CM | POA: Insufficient documentation

## 2016-01-12 ENCOUNTER — Encounter (HOSPITAL_COMMUNITY)
Admission: RE | Admit: 2016-01-12 | Discharge: 2016-01-12 | Disposition: A | Discharge status: Home / Self Care | Payer: Medicare Other | Source: Ambulatory Visit | Attending: Nephrology | Admitting: Nephrology

## 2016-01-12 DIAGNOSIS — Z5181 Encounter for therapeutic drug level monitoring: Secondary | ICD-10-CM | POA: Diagnosis not present

## 2016-01-12 DIAGNOSIS — Z79899 Other long term (current) drug therapy: Secondary | ICD-10-CM | POA: Diagnosis not present

## 2016-01-12 DIAGNOSIS — N183 Chronic kidney disease, stage 3 (moderate): Secondary | ICD-10-CM | POA: Diagnosis not present

## 2016-01-12 DIAGNOSIS — D631 Anemia in chronic kidney disease: Secondary | ICD-10-CM | POA: Diagnosis not present

## 2016-01-12 DIAGNOSIS — D638 Anemia in other chronic diseases classified elsewhere: Secondary | ICD-10-CM

## 2016-01-12 LAB — POCT HEMOGLOBIN-HEMACUE: Hemoglobin: 8.8 g/dL — ABNORMAL LOW (ref 12.0–15.0)

## 2016-01-12 MED ORDER — EPOETIN ALFA 10000 UNIT/ML IJ SOLN
5000.0000 [IU] | INTRAMUSCULAR | Status: DC
  Administered 2016-01-12: 5000 [IU] via SUBCUTANEOUS

## 2016-01-12 MED ORDER — EPOETIN ALFA 10000 UNIT/ML IJ SOLN
INTRAMUSCULAR | Status: AC
  Administered 2016-01-12: 5000 [IU] via SUBCUTANEOUS
  Filled 2016-01-12: qty 1

## 2016-01-19 ENCOUNTER — Encounter (HOSPITAL_COMMUNITY)
Admission: RE | Admit: 2016-01-19 | Discharge: 2016-01-19 | Disposition: A | Discharge status: Home / Self Care | Payer: Medicare Other | Source: Ambulatory Visit | Attending: Nephrology | Admitting: Nephrology

## 2016-01-19 DIAGNOSIS — N183 Chronic kidney disease, stage 3 (moderate): Secondary | ICD-10-CM | POA: Diagnosis not present

## 2016-01-19 DIAGNOSIS — Z5181 Encounter for therapeutic drug level monitoring: Secondary | ICD-10-CM | POA: Diagnosis not present

## 2016-01-19 DIAGNOSIS — D631 Anemia in chronic kidney disease: Secondary | ICD-10-CM | POA: Diagnosis not present

## 2016-01-19 DIAGNOSIS — D638 Anemia in other chronic diseases classified elsewhere: Secondary | ICD-10-CM

## 2016-01-19 DIAGNOSIS — Z79899 Other long term (current) drug therapy: Secondary | ICD-10-CM | POA: Diagnosis not present

## 2016-01-19 LAB — POCT HEMOGLOBIN-HEMACUE: Hemoglobin: 8.2 g/dL — ABNORMAL LOW (ref 12.0–15.0)

## 2016-01-19 MED ORDER — EPOETIN ALFA 10000 UNIT/ML IJ SOLN
5000.0000 [IU] | INTRAMUSCULAR | Status: DC
  Administered 2016-01-19: 5000 [IU] via SUBCUTANEOUS

## 2016-01-19 MED ORDER — EPOETIN ALFA 10000 UNIT/ML IJ SOLN
INTRAMUSCULAR | Status: AC
  Administered 2016-01-19: 5000 [IU] via SUBCUTANEOUS
  Filled 2016-01-19: qty 1

## 2016-01-26 ENCOUNTER — Encounter (HOSPITAL_COMMUNITY)
Admission: RE | Admit: 2016-01-26 | Discharge: 2016-01-26 | Disposition: A | Discharge status: Home / Self Care | Payer: Medicare Other | Source: Ambulatory Visit | Attending: Nephrology | Admitting: Nephrology

## 2016-01-26 DIAGNOSIS — Z5181 Encounter for therapeutic drug level monitoring: Secondary | ICD-10-CM | POA: Diagnosis not present

## 2016-01-26 DIAGNOSIS — D631 Anemia in chronic kidney disease: Secondary | ICD-10-CM | POA: Insufficient documentation

## 2016-01-26 DIAGNOSIS — Z79899 Other long term (current) drug therapy: Secondary | ICD-10-CM | POA: Insufficient documentation

## 2016-01-26 DIAGNOSIS — N183 Chronic kidney disease, stage 3 (moderate): Secondary | ICD-10-CM | POA: Insufficient documentation

## 2016-01-26 DIAGNOSIS — D638 Anemia in other chronic diseases classified elsewhere: Secondary | ICD-10-CM

## 2016-01-26 LAB — FERRITIN: Ferritin: 189 ng/mL (ref 11–307)

## 2016-01-26 LAB — IRON AND TIBC
IRON: 14 ug/dL — AB (ref 28–170)
SATURATION RATIOS: 6 % — AB (ref 10.4–31.8)
TIBC: 230 ug/dL — ABNORMAL LOW (ref 250–450)
UIBC: 216 ug/dL

## 2016-01-26 LAB — POCT HEMOGLOBIN-HEMACUE: Hemoglobin: 8.4 g/dL — ABNORMAL LOW (ref 12.0–15.0)

## 2016-01-26 MED ORDER — EPOETIN ALFA 10000 UNIT/ML IJ SOLN
INTRAMUSCULAR | Status: AC
  Filled 2016-01-26: qty 1

## 2016-01-26 MED ORDER — EPOETIN ALFA 10000 UNIT/ML IJ SOLN
5000.0000 [IU] | INTRAMUSCULAR | Status: DC
  Administered 2016-01-26: 5000 [IU] via SUBCUTANEOUS

## 2016-02-02 ENCOUNTER — Encounter (HOSPITAL_COMMUNITY)
Admission: RE | Admit: 2016-02-02 | Discharge: 2016-02-02 | Disposition: A | Discharge status: Home / Self Care | Payer: Medicare Other | Source: Ambulatory Visit | Attending: Nephrology | Admitting: Nephrology

## 2016-02-02 DIAGNOSIS — D631 Anemia in chronic kidney disease: Secondary | ICD-10-CM | POA: Diagnosis not present

## 2016-02-02 DIAGNOSIS — N183 Chronic kidney disease, stage 3 (moderate): Secondary | ICD-10-CM | POA: Diagnosis not present

## 2016-02-02 DIAGNOSIS — Z5181 Encounter for therapeutic drug level monitoring: Secondary | ICD-10-CM | POA: Diagnosis not present

## 2016-02-02 DIAGNOSIS — Z79899 Other long term (current) drug therapy: Secondary | ICD-10-CM | POA: Diagnosis not present

## 2016-02-02 DIAGNOSIS — D638 Anemia in other chronic diseases classified elsewhere: Secondary | ICD-10-CM

## 2016-02-02 LAB — POCT HEMOGLOBIN-HEMACUE: HEMOGLOBIN: 8.5 g/dL — AB (ref 12.0–15.0)

## 2016-02-02 MED ORDER — EPOETIN ALFA 10000 UNIT/ML IJ SOLN
INTRAMUSCULAR | Status: AC
  Filled 2016-02-02: qty 1

## 2016-02-02 MED ORDER — EPOETIN ALFA 10000 UNIT/ML IJ SOLN
5000.0000 [IU] | INTRAMUSCULAR | Status: DC
  Administered 2016-02-02: 5000 [IU] via SUBCUTANEOUS

## 2016-02-09 ENCOUNTER — Encounter (HOSPITAL_COMMUNITY)
Admission: RE | Admit: 2016-02-09 | Discharge: 2016-02-09 | Disposition: A | Discharge status: Home / Self Care | Payer: Medicare Other | Source: Ambulatory Visit | Attending: Nephrology | Admitting: Nephrology

## 2016-02-09 DIAGNOSIS — Z5181 Encounter for therapeutic drug level monitoring: Secondary | ICD-10-CM | POA: Diagnosis not present

## 2016-02-09 DIAGNOSIS — D638 Anemia in other chronic diseases classified elsewhere: Secondary | ICD-10-CM

## 2016-02-09 DIAGNOSIS — D631 Anemia in chronic kidney disease: Secondary | ICD-10-CM | POA: Diagnosis not present

## 2016-02-09 DIAGNOSIS — N183 Chronic kidney disease, stage 3 (moderate): Secondary | ICD-10-CM | POA: Diagnosis not present

## 2016-02-09 DIAGNOSIS — Z79899 Other long term (current) drug therapy: Secondary | ICD-10-CM | POA: Diagnosis not present

## 2016-02-09 LAB — POCT HEMOGLOBIN-HEMACUE: Hemoglobin: 9.3 g/dL — ABNORMAL LOW (ref 12.0–15.0)

## 2016-02-09 MED ORDER — EPOETIN ALFA 10000 UNIT/ML IJ SOLN
5000.0000 [IU] | INTRAMUSCULAR | Status: DC
  Administered 2016-02-09: 5000 [IU] via SUBCUTANEOUS

## 2016-02-09 MED ORDER — EPOETIN ALFA 10000 UNIT/ML IJ SOLN
INTRAMUSCULAR | Status: AC
  Filled 2016-02-09: qty 1

## 2016-02-14 ENCOUNTER — Encounter (HOSPITAL_COMMUNITY): Payer: Medicare Other

## 2016-02-16 ENCOUNTER — Encounter (HOSPITAL_COMMUNITY)
Admission: RE | Admit: 2016-02-16 | Discharge: 2016-02-16 | Disposition: A | Discharge status: Home / Self Care | Payer: Medicare Other | Source: Ambulatory Visit | Attending: Nephrology | Admitting: Nephrology

## 2016-02-16 ENCOUNTER — Other Ambulatory Visit (HOSPITAL_COMMUNITY): Payer: Self-pay | Admitting: *Deleted

## 2016-02-16 ENCOUNTER — Encounter (HOSPITAL_COMMUNITY): Payer: Medicare Other

## 2016-02-16 DIAGNOSIS — D638 Anemia in other chronic diseases classified elsewhere: Secondary | ICD-10-CM

## 2016-02-16 DIAGNOSIS — D631 Anemia in chronic kidney disease: Secondary | ICD-10-CM | POA: Diagnosis not present

## 2016-02-16 DIAGNOSIS — Z79899 Other long term (current) drug therapy: Secondary | ICD-10-CM | POA: Diagnosis not present

## 2016-02-16 DIAGNOSIS — Z5181 Encounter for therapeutic drug level monitoring: Secondary | ICD-10-CM | POA: Diagnosis not present

## 2016-02-16 DIAGNOSIS — D509 Iron deficiency anemia, unspecified: Secondary | ICD-10-CM | POA: Diagnosis not present

## 2016-02-16 DIAGNOSIS — N183 Chronic kidney disease, stage 3 (moderate): Secondary | ICD-10-CM | POA: Diagnosis not present

## 2016-02-16 LAB — FERRITIN: Ferritin: 170 ng/mL (ref 11–307)

## 2016-02-16 LAB — IRON AND TIBC
IRON: 14 ug/dL — AB (ref 28–170)
SATURATION RATIOS: 7 % — AB (ref 10.4–31.8)
TIBC: 210 ug/dL — AB (ref 250–450)
UIBC: 196 ug/dL

## 2016-02-16 LAB — POCT HEMOGLOBIN-HEMACUE: Hemoglobin: 7.3 g/dL — ABNORMAL LOW (ref 12.0–15.0)

## 2016-02-16 MED ORDER — EPOETIN ALFA 10000 UNIT/ML IJ SOLN: 5000.0000 [IU] | INTRAMUSCULAR | Status: DC

## 2016-02-16 MED ORDER — EPOETIN ALFA 10000 UNIT/ML IJ SOLN
10000.0000 [IU] | Freq: Once | INTRAMUSCULAR | Status: AC
  Administered 2016-02-16: 10000 [IU] via SUBCUTANEOUS

## 2016-02-16 MED ORDER — EPOETIN ALFA 10000 UNIT/ML IJ SOLN
INTRAMUSCULAR | Status: AC
  Filled 2016-02-16: qty 1

## 2016-02-16 NOTE — Progress Notes (Signed)
Hemoglobin 7.3 on recheck.  Has dropped from 9.3 last visit. Dr Jason Nest office called and spoke with Julia [name] who spoke with Dr Justin Mend.  New orders received for Iron levels, and procrit 10,000 units.  Pt denies any symptoms. Instructed pt has been instructed that the office will be sending them stool cards and to go to the emergency room if she has any signs of bleeding.

## 2016-02-23 ENCOUNTER — Encounter (HOSPITAL_COMMUNITY): Payer: Medicare Other

## 2016-02-23 ENCOUNTER — Encounter (HOSPITAL_COMMUNITY)
Admission: RE | Admit: 2016-02-23 | Discharge: 2016-02-23 | Disposition: A | Discharge status: Home / Self Care | Payer: Medicare Other | Source: Ambulatory Visit | Attending: Nephrology | Admitting: Nephrology

## 2016-02-23 DIAGNOSIS — N183 Chronic kidney disease, stage 3 (moderate): Secondary | ICD-10-CM | POA: Diagnosis not present

## 2016-02-23 DIAGNOSIS — Z5181 Encounter for therapeutic drug level monitoring: Secondary | ICD-10-CM | POA: Diagnosis not present

## 2016-02-23 DIAGNOSIS — D638 Anemia in other chronic diseases classified elsewhere: Secondary | ICD-10-CM

## 2016-02-23 DIAGNOSIS — D631 Anemia in chronic kidney disease: Secondary | ICD-10-CM | POA: Diagnosis not present

## 2016-02-23 DIAGNOSIS — Z79899 Other long term (current) drug therapy: Secondary | ICD-10-CM | POA: Diagnosis not present

## 2016-02-23 LAB — POCT HEMOGLOBIN-HEMACUE: Hemoglobin: 7.4 g/dL — ABNORMAL LOW (ref 12.0–15.0)

## 2016-02-23 MED ORDER — EPOETIN ALFA 10000 UNIT/ML IJ SOLN
INTRAMUSCULAR | Status: AC
  Filled 2016-02-23: qty 1

## 2016-02-23 MED ORDER — FERUMOXYTOL INJECTION 510 MG/17 ML
510.0000 mg | INTRAVENOUS | Status: DC
  Administered 2016-02-23: 510 mg via INTRAVENOUS
  Filled 2016-02-23: qty 17

## 2016-02-23 MED ORDER — EPOETIN ALFA 10000 UNIT/ML IJ SOLN
10000.0000 [IU] | INTRAMUSCULAR | Status: DC
  Administered 2016-02-23: 10000 [IU] via SUBCUTANEOUS

## 2016-02-23 NOTE — Discharge Instructions (Signed)

## 2016-02-23 NOTE — Progress Notes (Signed)
HGB 7.4 pt  Asymptomatic, Stacey at Dr Justin Mend office notified.  No new orders will continue to monitor pt.

## 2016-02-28 ENCOUNTER — Other Ambulatory Visit (HOSPITAL_COMMUNITY): Payer: Self-pay | Admitting: *Deleted

## 2016-02-29 ENCOUNTER — Encounter (HOSPITAL_COMMUNITY)
Admission: RE | Admit: 2016-02-29 | Discharge: 2016-02-29 | Disposition: A | Discharge status: Home / Self Care | Payer: Medicare Other | Source: Ambulatory Visit | Attending: Nephrology | Admitting: Nephrology

## 2016-02-29 DIAGNOSIS — Z5181 Encounter for therapeutic drug level monitoring: Secondary | ICD-10-CM | POA: Diagnosis not present

## 2016-02-29 DIAGNOSIS — D631 Anemia in chronic kidney disease: Secondary | ICD-10-CM | POA: Diagnosis not present

## 2016-02-29 DIAGNOSIS — N183 Chronic kidney disease, stage 3 (moderate): Secondary | ICD-10-CM | POA: Insufficient documentation

## 2016-02-29 DIAGNOSIS — Z79899 Other long term (current) drug therapy: Secondary | ICD-10-CM | POA: Diagnosis not present

## 2016-02-29 DIAGNOSIS — D638 Anemia in other chronic diseases classified elsewhere: Secondary | ICD-10-CM

## 2016-02-29 LAB — POCT HEMOGLOBIN-HEMACUE: Hemoglobin: 7.3 g/dL — ABNORMAL LOW (ref 12.0–15.0)

## 2016-02-29 MED ORDER — EPOETIN ALFA 10000 UNIT/ML IJ SOLN
10000.0000 [IU] | INTRAMUSCULAR | Status: DC
  Administered 2016-02-29: 10000 [IU] via SUBCUTANEOUS

## 2016-02-29 MED ORDER — SODIUM CHLORIDE 0.9 % IV SOLN
510.0000 mg | INTRAVENOUS | Status: DC
  Filled 2016-02-29 (×2): qty 17

## 2016-02-29 MED ORDER — SODIUM CHLORIDE 0.9 % IV SOLN
510.0000 mg | INTRAVENOUS | Status: AC
  Administered 2016-02-29: 510 mg via INTRAVENOUS
  Filled 2016-02-29: qty 17

## 2016-02-29 MED ORDER — EPOETIN ALFA 10000 UNIT/ML IJ SOLN
INTRAMUSCULAR | Status: AC
  Filled 2016-02-29: qty 1

## 2016-02-29 NOTE — Progress Notes (Signed)
Heard back from [place] at [place]. No new orders.

## 2016-02-29 NOTE — Progress Notes (Signed)
Hgb reported to Stacy at [facility].

## 2016-02-29 NOTE — Progress Notes (Signed)
Nurse was successful in getting the PIV. [employer]

## 2016-03-04 DIAGNOSIS — N329 Bladder disorder, unspecified: Secondary | ICD-10-CM | POA: Diagnosis not present

## 2016-03-06 ENCOUNTER — Encounter (HOSPITAL_COMMUNITY)
Admission: RE | Admit: 2016-03-06 | Discharge: 2016-03-06 | Disposition: A | Discharge status: Home / Self Care | Payer: Medicare Other | Source: Ambulatory Visit | Attending: Nephrology | Admitting: Nephrology

## 2016-03-06 DIAGNOSIS — N183 Chronic kidney disease, stage 3 (moderate): Secondary | ICD-10-CM | POA: Diagnosis not present

## 2016-03-06 DIAGNOSIS — D631 Anemia in chronic kidney disease: Secondary | ICD-10-CM | POA: Diagnosis not present

## 2016-03-06 DIAGNOSIS — Z5181 Encounter for therapeutic drug level monitoring: Secondary | ICD-10-CM | POA: Diagnosis not present

## 2016-03-06 DIAGNOSIS — Z79899 Other long term (current) drug therapy: Secondary | ICD-10-CM | POA: Diagnosis not present

## 2016-03-06 DIAGNOSIS — D638 Anemia in other chronic diseases classified elsewhere: Secondary | ICD-10-CM

## 2016-03-06 MED ORDER — EPOETIN ALFA 10000 UNIT/ML IJ SOLN
INTRAMUSCULAR | Status: AC
  Filled 2016-03-06: qty 1

## 2016-03-06 MED ORDER — EPOETIN ALFA 10000 UNIT/ML IJ SOLN
10000.0000 [IU] | INTRAMUSCULAR | Status: DC
  Administered 2016-03-06: 10000 [IU] via SUBCUTANEOUS

## 2016-03-06 NOTE — Progress Notes (Signed)
Hgb. Today 7.6; left message for Stacy at [facility]

## 2016-03-07 LAB — POCT HEMOGLOBIN-HEMACUE: HEMOGLOBIN: 7.6 g/dL — AB (ref 12.0–15.0)

## 2016-03-08 DIAGNOSIS — J329 Chronic sinusitis, unspecified: Secondary | ICD-10-CM | POA: Diagnosis not present

## 2016-03-08 DIAGNOSIS — I509 Heart failure, unspecified: Secondary | ICD-10-CM | POA: Diagnosis not present

## 2016-03-08 DIAGNOSIS — I10 Essential (primary) hypertension: Secondary | ICD-10-CM | POA: Diagnosis not present

## 2016-03-08 DIAGNOSIS — N189 Chronic kidney disease, unspecified: Secondary | ICD-10-CM | POA: Diagnosis not present

## 2016-03-15 ENCOUNTER — Encounter (HOSPITAL_COMMUNITY)
Admission: RE | Admit: 2016-03-15 | Discharge: 2016-03-15 | Disposition: A | Discharge status: Home / Self Care | Payer: Medicare Other | Source: Ambulatory Visit | Attending: Nephrology | Admitting: Nephrology

## 2016-03-15 DIAGNOSIS — D631 Anemia in chronic kidney disease: Secondary | ICD-10-CM | POA: Diagnosis not present

## 2016-03-15 DIAGNOSIS — Z79899 Other long term (current) drug therapy: Secondary | ICD-10-CM | POA: Diagnosis not present

## 2016-03-15 DIAGNOSIS — Z5181 Encounter for therapeutic drug level monitoring: Secondary | ICD-10-CM | POA: Diagnosis not present

## 2016-03-15 DIAGNOSIS — N183 Chronic kidney disease, stage 3 (moderate): Secondary | ICD-10-CM | POA: Diagnosis not present

## 2016-03-15 DIAGNOSIS — D638 Anemia in other chronic diseases classified elsewhere: Secondary | ICD-10-CM

## 2016-03-15 LAB — IRON AND TIBC
IRON: 34 ug/dL (ref 28–170)
Saturation Ratios: 18 % (ref 10.4–31.8)
TIBC: 188 ug/dL — ABNORMAL LOW (ref 250–450)
UIBC: 154 ug/dL

## 2016-03-15 LAB — POCT HEMOGLOBIN-HEMACUE: HEMOGLOBIN: 8.7 g/dL — AB (ref 12.0–15.0)

## 2016-03-15 LAB — FERRITIN: FERRITIN: 552 ng/mL — AB (ref 11–307)

## 2016-03-15 MED ORDER — EPOETIN ALFA 10000 UNIT/ML IJ SOLN
10000.0000 [IU] | INTRAMUSCULAR | Status: DC
  Administered 2016-03-15: 10000 [IU] via SUBCUTANEOUS

## 2016-03-15 MED ORDER — EPOETIN ALFA 10000 UNIT/ML IJ SOLN
INTRAMUSCULAR | Status: AC
  Filled 2016-03-15: qty 1

## 2016-03-20 ENCOUNTER — Encounter (HOSPITAL_COMMUNITY)
Admission: RE | Admit: 2016-03-20 | Discharge: 2016-03-20 | Disposition: A | Discharge status: Home / Self Care | Payer: Medicare Other | Source: Ambulatory Visit | Attending: Nephrology | Admitting: Nephrology

## 2016-03-20 DIAGNOSIS — N183 Chronic kidney disease, stage 3 (moderate): Secondary | ICD-10-CM | POA: Diagnosis not present

## 2016-03-20 DIAGNOSIS — D631 Anemia in chronic kidney disease: Secondary | ICD-10-CM | POA: Diagnosis not present

## 2016-03-20 DIAGNOSIS — Z79899 Other long term (current) drug therapy: Secondary | ICD-10-CM | POA: Diagnosis not present

## 2016-03-20 DIAGNOSIS — D638 Anemia in other chronic diseases classified elsewhere: Secondary | ICD-10-CM

## 2016-03-20 DIAGNOSIS — Z5181 Encounter for therapeutic drug level monitoring: Secondary | ICD-10-CM | POA: Diagnosis not present

## 2016-03-20 LAB — POCT HEMOGLOBIN-HEMACUE: HEMOGLOBIN: 8.8 g/dL — AB (ref 12.0–15.0)

## 2016-03-20 MED ORDER — EPOETIN ALFA 10000 UNIT/ML IJ SOLN
10000.0000 [IU] | INTRAMUSCULAR | Status: DC
  Administered 2016-03-20: 10000 [IU] via SUBCUTANEOUS

## 2016-03-20 MED ORDER — EPOETIN ALFA 10000 UNIT/ML IJ SOLN
INTRAMUSCULAR | Status: AC
  Filled 2016-03-20: qty 1

## 2016-03-27 ENCOUNTER — Encounter (HOSPITAL_COMMUNITY)
Admission: RE | Admit: 2016-03-27 | Discharge: 2016-03-27 | Disposition: A | Discharge status: Home / Self Care | Payer: Medicare Other | Source: Ambulatory Visit | Attending: Nephrology | Admitting: Nephrology

## 2016-03-27 DIAGNOSIS — D631 Anemia in chronic kidney disease: Secondary | ICD-10-CM | POA: Insufficient documentation

## 2016-03-27 DIAGNOSIS — D638 Anemia in other chronic diseases classified elsewhere: Secondary | ICD-10-CM

## 2016-03-27 DIAGNOSIS — Z5181 Encounter for therapeutic drug level monitoring: Secondary | ICD-10-CM | POA: Diagnosis not present

## 2016-03-27 DIAGNOSIS — Z79899 Other long term (current) drug therapy: Secondary | ICD-10-CM | POA: Diagnosis not present

## 2016-03-27 DIAGNOSIS — N183 Chronic kidney disease, stage 3 (moderate): Secondary | ICD-10-CM | POA: Insufficient documentation

## 2016-03-27 LAB — POCT HEMOGLOBIN-HEMACUE: Hemoglobin: 9.2 g/dL — ABNORMAL LOW (ref 12.0–15.0)

## 2016-03-27 MED ORDER — EPOETIN ALFA 10000 UNIT/ML IJ SOLN
INTRAMUSCULAR | Status: AC
  Filled 2016-03-27: qty 1

## 2016-03-27 MED ORDER — EPOETIN ALFA 10000 UNIT/ML IJ SOLN
10000.0000 [IU] | INTRAMUSCULAR | Status: DC
  Administered 2016-03-27: 10000 [IU] via SUBCUTANEOUS

## 2016-04-04 ENCOUNTER — Encounter (HOSPITAL_COMMUNITY)
Admission: RE | Admit: 2016-04-04 | Discharge: 2016-04-04 | Disposition: A | Discharge status: Home / Self Care | Payer: Medicare Other | Source: Ambulatory Visit | Attending: Nephrology | Admitting: Nephrology

## 2016-04-04 DIAGNOSIS — Z5181 Encounter for therapeutic drug level monitoring: Secondary | ICD-10-CM | POA: Diagnosis not present

## 2016-04-04 DIAGNOSIS — Z79899 Other long term (current) drug therapy: Secondary | ICD-10-CM | POA: Diagnosis not present

## 2016-04-04 DIAGNOSIS — D638 Anemia in other chronic diseases classified elsewhere: Secondary | ICD-10-CM

## 2016-04-04 DIAGNOSIS — D631 Anemia in chronic kidney disease: Secondary | ICD-10-CM | POA: Diagnosis not present

## 2016-04-04 DIAGNOSIS — N183 Chronic kidney disease, stage 3 (moderate): Secondary | ICD-10-CM | POA: Diagnosis not present

## 2016-04-04 LAB — POCT HEMOGLOBIN-HEMACUE: HEMOGLOBIN: 9.4 g/dL — AB (ref 12.0–15.0)

## 2016-04-04 MED ORDER — EPOETIN ALFA 10000 UNIT/ML IJ SOLN
10000.0000 [IU] | INTRAMUSCULAR | Status: DC
  Administered 2016-04-04: 10000 [IU] via SUBCUTANEOUS

## 2016-04-04 MED ORDER — EPOETIN ALFA 10000 UNIT/ML IJ SOLN
INTRAMUSCULAR | Status: AC
  Filled 2016-04-04: qty 1

## 2016-04-05 DIAGNOSIS — N2581 Secondary hyperparathyroidism of renal origin: Secondary | ICD-10-CM | POA: Diagnosis not present

## 2016-04-05 DIAGNOSIS — I129 Hypertensive chronic kidney disease with stage 1 through stage 4 chronic kidney disease, or unspecified chronic kidney disease: Secondary | ICD-10-CM | POA: Diagnosis not present

## 2016-04-05 DIAGNOSIS — N189 Chronic kidney disease, unspecified: Secondary | ICD-10-CM | POA: Diagnosis not present

## 2016-04-11 ENCOUNTER — Encounter (HOSPITAL_COMMUNITY)
Admission: RE | Admit: 2016-04-11 | Discharge: 2016-04-11 | Disposition: A | Discharge status: Home / Self Care | Payer: Medicare Other | Source: Ambulatory Visit | Attending: Nephrology | Admitting: Nephrology

## 2016-04-11 DIAGNOSIS — Z79899 Other long term (current) drug therapy: Secondary | ICD-10-CM | POA: Diagnosis not present

## 2016-04-11 DIAGNOSIS — N183 Chronic kidney disease, stage 3 (moderate): Secondary | ICD-10-CM | POA: Diagnosis not present

## 2016-04-11 DIAGNOSIS — D638 Anemia in other chronic diseases classified elsewhere: Secondary | ICD-10-CM

## 2016-04-11 DIAGNOSIS — Z5181 Encounter for therapeutic drug level monitoring: Secondary | ICD-10-CM | POA: Diagnosis not present

## 2016-04-11 DIAGNOSIS — D631 Anemia in chronic kidney disease: Secondary | ICD-10-CM | POA: Diagnosis not present

## 2016-04-11 LAB — IRON AND TIBC
Iron: 30 ug/dL (ref 28–170)
Saturation Ratios: 15 % (ref 10.4–31.8)
TIBC: 204 ug/dL — AB (ref 250–450)
UIBC: 174 ug/dL

## 2016-04-11 LAB — FERRITIN: Ferritin: 367 ng/mL — ABNORMAL HIGH (ref 11–307)

## 2016-04-11 LAB — POCT HEMOGLOBIN-HEMACUE: Hemoglobin: 9.8 g/dL — ABNORMAL LOW (ref 12.0–15.0)

## 2016-04-11 MED ORDER — EPOETIN ALFA 10000 UNIT/ML IJ SOLN
INTRAMUSCULAR | Status: AC
  Filled 2016-04-11: qty 1

## 2016-04-11 MED ORDER — EPOETIN ALFA 10000 UNIT/ML IJ SOLN
10000.0000 [IU] | INTRAMUSCULAR | Status: DC
  Administered 2016-04-11: 10000 [IU] via SUBCUTANEOUS

## 2016-04-17 ENCOUNTER — Encounter (HOSPITAL_COMMUNITY): Payer: Medicare Other

## 2016-04-25 ENCOUNTER — Encounter (HOSPITAL_COMMUNITY)
Admission: RE | Admit: 2016-04-25 | Discharge: 2016-04-25 | Disposition: A | Discharge status: Home / Self Care | Payer: Medicare Other | Source: Ambulatory Visit | Attending: Nephrology | Admitting: Nephrology

## 2016-04-25 DIAGNOSIS — Z79899 Other long term (current) drug therapy: Secondary | ICD-10-CM | POA: Diagnosis not present

## 2016-04-25 DIAGNOSIS — D638 Anemia in other chronic diseases classified elsewhere: Secondary | ICD-10-CM

## 2016-04-25 DIAGNOSIS — D631 Anemia in chronic kidney disease: Secondary | ICD-10-CM | POA: Diagnosis not present

## 2016-04-25 DIAGNOSIS — Z5181 Encounter for therapeutic drug level monitoring: Secondary | ICD-10-CM | POA: Diagnosis not present

## 2016-04-25 DIAGNOSIS — N183 Chronic kidney disease, stage 3 (moderate): Secondary | ICD-10-CM | POA: Diagnosis not present

## 2016-04-25 LAB — POCT HEMOGLOBIN-HEMACUE: HEMOGLOBIN: 10.1 g/dL — AB (ref 12.0–15.0)

## 2016-04-25 MED ORDER — EPOETIN ALFA 10000 UNIT/ML IJ SOLN
INTRAMUSCULAR | Status: AC
  Administered 2016-04-25: 10000 [IU] via SUBCUTANEOUS
  Filled 2016-04-25: qty 1

## 2016-04-25 MED ORDER — EPOETIN ALFA 10000 UNIT/ML IJ SOLN
10000.0000 [IU] | INTRAMUSCULAR | Status: DC
  Administered 2016-04-25: 10000 [IU] via SUBCUTANEOUS

## 2016-05-08 ENCOUNTER — Other Ambulatory Visit (HOSPITAL_COMMUNITY): Payer: Self-pay | Admitting: *Deleted

## 2016-05-09 ENCOUNTER — Encounter (HOSPITAL_COMMUNITY)
Admission: RE | Admit: 2016-05-09 | Discharge: 2016-05-09 | Disposition: A | Discharge status: Home / Self Care | Payer: Medicare Other | Source: Ambulatory Visit | Attending: Nephrology | Admitting: Nephrology

## 2016-05-09 DIAGNOSIS — N183 Chronic kidney disease, stage 3 (moderate): Secondary | ICD-10-CM | POA: Diagnosis not present

## 2016-05-09 DIAGNOSIS — D631 Anemia in chronic kidney disease: Secondary | ICD-10-CM | POA: Diagnosis not present

## 2016-05-09 DIAGNOSIS — D638 Anemia in other chronic diseases classified elsewhere: Secondary | ICD-10-CM

## 2016-05-09 DIAGNOSIS — Z79899 Other long term (current) drug therapy: Secondary | ICD-10-CM | POA: Insufficient documentation

## 2016-05-09 DIAGNOSIS — Z5181 Encounter for therapeutic drug level monitoring: Secondary | ICD-10-CM | POA: Insufficient documentation

## 2016-05-09 LAB — IRON AND TIBC
Iron: 26 ug/dL — ABNORMAL LOW (ref 28–170)
SATURATION RATIOS: 13 % (ref 10.4–31.8)
TIBC: 204 ug/dL — ABNORMAL LOW (ref 250–450)
UIBC: 178 ug/dL

## 2016-05-09 LAB — FERRITIN: Ferritin: 248 ng/mL (ref 11–307)

## 2016-05-09 LAB — POCT HEMOGLOBIN-HEMACUE: HEMOGLOBIN: 9.9 g/dL — AB (ref 12.0–15.0)

## 2016-05-09 MED ORDER — EPOETIN ALFA 10000 UNIT/ML IJ SOLN
INTRAMUSCULAR | Status: AC
  Administered 2016-05-09: 10000 [IU] via SUBCUTANEOUS
  Filled 2016-05-09: qty 1

## 2016-05-09 MED ORDER — EPOETIN ALFA 10000 UNIT/ML IJ SOLN: 10000.0000 [IU] | INTRAMUSCULAR | Status: DC

## 2016-05-16 ENCOUNTER — Encounter (HOSPITAL_COMMUNITY)
Admission: RE | Admit: 2016-05-16 | Discharge: 2016-05-16 | Disposition: A | Discharge status: Home / Self Care | Payer: Medicare Other | Source: Ambulatory Visit | Attending: Nephrology | Admitting: Nephrology

## 2016-05-16 DIAGNOSIS — N183 Chronic kidney disease, stage 3 (moderate): Secondary | ICD-10-CM | POA: Diagnosis not present

## 2016-05-16 DIAGNOSIS — D631 Anemia in chronic kidney disease: Secondary | ICD-10-CM | POA: Diagnosis not present

## 2016-05-16 DIAGNOSIS — Z79899 Other long term (current) drug therapy: Secondary | ICD-10-CM | POA: Diagnosis not present

## 2016-05-16 DIAGNOSIS — D638 Anemia in other chronic diseases classified elsewhere: Secondary | ICD-10-CM

## 2016-05-16 DIAGNOSIS — Z5181 Encounter for therapeutic drug level monitoring: Secondary | ICD-10-CM | POA: Diagnosis not present

## 2016-05-16 LAB — POCT HEMOGLOBIN-HEMACUE: HEMOGLOBIN: 10 g/dL — AB (ref 12.0–15.0)

## 2016-05-16 MED ORDER — EPOETIN ALFA 10000 UNIT/ML IJ SOLN
10000.0000 [IU] | INTRAMUSCULAR | Status: DC
  Administered 2016-05-16: 10000 [IU] via SUBCUTANEOUS

## 2016-05-16 MED ORDER — EPOETIN ALFA 10000 UNIT/ML IJ SOLN
INTRAMUSCULAR | Status: AC
Start: 2016-05-16 — End: 2016-05-16
  Filled 2016-05-16: qty 1

## 2016-05-16 MED ORDER — EPOETIN ALFA 20000 UNIT/ML IJ SOLN
INTRAMUSCULAR | Status: AC
  Filled 2016-05-16: qty 1

## 2016-05-23 ENCOUNTER — Encounter (HOSPITAL_COMMUNITY)
Admission: RE | Admit: 2016-05-23 | Discharge: 2016-05-23 | Disposition: A | Discharge status: Home / Self Care | Payer: Medicare Other | Source: Ambulatory Visit | Attending: Nephrology | Admitting: Nephrology

## 2016-05-23 DIAGNOSIS — Z5181 Encounter for therapeutic drug level monitoring: Secondary | ICD-10-CM | POA: Diagnosis not present

## 2016-05-23 DIAGNOSIS — N183 Chronic kidney disease, stage 3 (moderate): Secondary | ICD-10-CM | POA: Diagnosis not present

## 2016-05-23 DIAGNOSIS — D631 Anemia in chronic kidney disease: Secondary | ICD-10-CM | POA: Diagnosis not present

## 2016-05-23 DIAGNOSIS — Z79899 Other long term (current) drug therapy: Secondary | ICD-10-CM | POA: Diagnosis not present

## 2016-05-23 DIAGNOSIS — D638 Anemia in other chronic diseases classified elsewhere: Secondary | ICD-10-CM

## 2016-05-23 LAB — POCT HEMOGLOBIN-HEMACUE: HEMOGLOBIN: 9.6 g/dL — AB (ref 12.0–15.0)

## 2016-05-23 MED ORDER — EPOETIN ALFA 10000 UNIT/ML IJ SOLN
10000.0000 [IU] | INTRAMUSCULAR | Status: DC
  Administered 2016-05-23: 10000 [IU] via SUBCUTANEOUS

## 2016-05-23 MED ORDER — EPOETIN ALFA 10000 UNIT/ML IJ SOLN
INTRAMUSCULAR | Status: AC
  Filled 2016-05-23: qty 1

## 2016-05-31 ENCOUNTER — Inpatient Hospital Stay (HOSPITAL_COMMUNITY): Admission: RE | Admit: 2016-05-31 | Payer: Medicare Other | Source: Ambulatory Visit

## 2016-06-10 ENCOUNTER — Encounter (HOSPITAL_COMMUNITY)
Admission: RE | Admit: 2016-06-10 | Discharge: 2016-06-10 | Disposition: A | Discharge status: Home / Self Care | Payer: Medicare Other | Source: Ambulatory Visit | Attending: Nephrology | Admitting: Nephrology

## 2016-06-10 DIAGNOSIS — Z79899 Other long term (current) drug therapy: Secondary | ICD-10-CM | POA: Diagnosis not present

## 2016-06-10 DIAGNOSIS — D638 Anemia in other chronic diseases classified elsewhere: Secondary | ICD-10-CM

## 2016-06-10 DIAGNOSIS — Z5181 Encounter for therapeutic drug level monitoring: Secondary | ICD-10-CM | POA: Diagnosis not present

## 2016-06-10 DIAGNOSIS — I10 Essential (primary) hypertension: Secondary | ICD-10-CM | POA: Diagnosis not present

## 2016-06-10 DIAGNOSIS — N183 Chronic kidney disease, stage 3 (moderate): Secondary | ICD-10-CM | POA: Insufficient documentation

## 2016-06-10 DIAGNOSIS — D631 Anemia in chronic kidney disease: Secondary | ICD-10-CM | POA: Diagnosis not present

## 2016-06-10 DIAGNOSIS — I509 Heart failure, unspecified: Secondary | ICD-10-CM | POA: Diagnosis not present

## 2016-06-10 DIAGNOSIS — D649 Anemia, unspecified: Secondary | ICD-10-CM | POA: Diagnosis not present

## 2016-06-10 DIAGNOSIS — N189 Chronic kidney disease, unspecified: Secondary | ICD-10-CM | POA: Diagnosis not present

## 2016-06-10 LAB — IRON AND TIBC
IRON: 34 ug/dL (ref 28–170)
Saturation Ratios: 16 % (ref 10.4–31.8)
TIBC: 216 ug/dL — ABNORMAL LOW (ref 250–450)
UIBC: 182 ug/dL

## 2016-06-10 LAB — POCT HEMOGLOBIN-HEMACUE: Hemoglobin: 9.7 g/dL — ABNORMAL LOW (ref 12.0–15.0)

## 2016-06-10 LAB — FERRITIN: Ferritin: 222 ng/mL (ref 11–307)

## 2016-06-10 MED ORDER — EPOETIN ALFA 10000 UNIT/ML IJ SOLN
INTRAMUSCULAR | Status: AC
  Administered 2016-06-10: 09:00:00 10000 [IU] via SUBCUTANEOUS
  Filled 2016-06-10: qty 1

## 2016-06-10 MED ORDER — EPOETIN ALFA 10000 UNIT/ML IJ SOLN: 10000.0000 [IU] | INTRAMUSCULAR | Status: DC

## 2016-06-20 ENCOUNTER — Encounter: Payer: Self-pay | Admitting: Cardiovascular Disease

## 2016-06-20 ENCOUNTER — Ambulatory Visit (INDEPENDENT_AMBULATORY_CARE_PROVIDER_SITE_OTHER): Payer: Medicare Other | Admitting: Cardiovascular Disease

## 2016-06-20 ENCOUNTER — Encounter (HOSPITAL_COMMUNITY)
Admission: RE | Admit: 2016-06-20 | Discharge: 2016-06-20 | Disposition: A | Discharge status: Home / Self Care | Payer: Medicare Other | Source: Ambulatory Visit | Attending: Nephrology | Admitting: Nephrology

## 2016-06-20 VITALS — BP 138/78 | HR 76 | Ht 67.0 in | Wt 140.5 lb

## 2016-06-20 DIAGNOSIS — Z5181 Encounter for therapeutic drug level monitoring: Secondary | ICD-10-CM | POA: Diagnosis not present

## 2016-06-20 DIAGNOSIS — I340 Nonrheumatic mitral (valve) insufficiency: Secondary | ICD-10-CM | POA: Diagnosis not present

## 2016-06-20 DIAGNOSIS — Z79899 Other long term (current) drug therapy: Secondary | ICD-10-CM | POA: Diagnosis not present

## 2016-06-20 DIAGNOSIS — D631 Anemia in chronic kidney disease: Secondary | ICD-10-CM | POA: Diagnosis not present

## 2016-06-20 DIAGNOSIS — I5022 Chronic systolic (congestive) heart failure: Secondary | ICD-10-CM

## 2016-06-20 DIAGNOSIS — D638 Anemia in other chronic diseases classified elsewhere: Secondary | ICD-10-CM

## 2016-06-20 DIAGNOSIS — N183 Chronic kidney disease, stage 3 (moderate): Secondary | ICD-10-CM | POA: Diagnosis not present

## 2016-06-20 LAB — POCT HEMOGLOBIN-HEMACUE: HEMOGLOBIN: 10.5 g/dL — AB (ref 12.0–15.0)

## 2016-06-20 MED ORDER — EPOETIN ALFA 10000 UNIT/ML IJ SOLN
10000.0000 [IU] | INTRAMUSCULAR | Status: DC
  Administered 2016-06-20: 10:00:00 10000 [IU] via SUBCUTANEOUS

## 2016-06-20 MED ORDER — EPOETIN ALFA 10000 UNIT/ML IJ SOLN
INTRAMUSCULAR | Status: AC
  Filled 2016-06-20: qty 1

## 2016-06-20 NOTE — Patient Instructions (Signed)

## 2016-06-20 NOTE — Progress Notes (Signed)
Cardiology Office Note   Date:  06/20/2016   ID:  Julia [name], DOB 12/10/1940, MRN [id number]  PCP:  London Pepper, MD  Cardiologist:   Mertie Moores, MD   Chief Complaint  Patient presents with  . Follow-up    CHF   Problem List:  1. Essential HTN 2. Chronic systolic CHF  - EF 55% -73% 3. Chronic renal disease - stage III 4. Severe MR by TEE    History of Present Illness:  Seen with granddaughter, Julia [name] is a 76 y.o. female who presents for eval of CHF and severe MR .  Recently moved from [place].   She's been treated with medicines and feels quite a bit better. She's not had any surgical repair.     No acute issues  Potassium was elevated recenly and her Aldactone , Ramipril were stopped.  Fairly active for 74 years.  Does her own shopping and housework .   Sleeps with 2 pillows.   Not particularly short of breat with 1 pillow  Had significant CHF in 2015 while in [place] ( no dyspnea, just leg edema )  She originally presented with congestive heart failure. Evaluation revealed that she had normal coronary arteries. She was found have severe mitral regurgitation due to a flail leaflet of the mitral valve. She was also found to have anemia in chronic kidney disease.   Jan. 25, 2018:  Mckenze is seen today for follow up visit Feeling well,  Echo showed moderate MR. LV function    Past Medical History:  Diagnosis Date  . CHF (congestive heart failure) ([place])   . Hypertension     No past surgical history on file.   Current Outpatient Prescriptions  Medication Sig Dispense Refill  . amLODipine (NORVASC) 2.5 MG tablet Take 2.5 mg by mouth daily.    . carvedilol (COREG) 25 MG tablet Take 25 mg by mouth 2 (two) times daily.  3   No current facility-administered medications for this visit.     Allergies:   Penicillins    Social History:  The patient  reports that she has quit smoking. She has never used smokeless tobacco. She reports that she  does not drink alcohol or use drugs.   Family History:  The patient's family history includes Alcohol abuse in her brother; Breast cancer in her sister; Cancer in her mother; Congestive Heart Failure in her sister; Diabetes in her sister; Diverticulitis in her brother; Gout in her sister; Healthy in her brother, brother, and daughter; Hypertension in her father and sister; Kidney disease in her sister; Lung disease in her sister; Stroke in her father.    ROS:  Please see the history of present illness.    Review of Systems: Constitutional:  denies fever, chills, diaphoresis, appetite change and fatigue.  HEENT: denies photophobia, eye pain, redness, hearing loss, ear pain, congestion, sore throat, rhinorrhea, sneezing, neck pain, neck stiffness and tinnitus.  Respiratory: denies SOB, DOE, cough, chest tightness, and wheezing.  Cardiovascular: denies chest pain, palpitations and leg swelling.  Gastrointestinal: denies nausea, vomiting, abdominal pain, diarrhea, constipation, blood in stool.  Genitourinary: denies dysuria, urgency, frequency, hematuria, flank pain and difficulty urinating.  Musculoskeletal: denies  myalgias, back pain, joint swelling, arthralgias and gait problem.   Skin: denies pallor, rash and wound.  Neurological: denies dizziness, seizures, syncope, weakness, light-headedness, numbness and headaches.   Hematological: denies adenopathy, easy bruising, personal or family bleeding history.  Psychiatric/ Behavioral: denies suicidal ideation, mood changes, confusion,  nervousness, sleep disturbance and agitation.       All other systems are reviewed and negative.    PHYSICAL EXAM: VS:  BP 138/78 (BP Location: Left Arm, Patient Position: Sitting, Cuff Size: Normal)   Pulse 76   Ht 5\' 7"  (1.702 m)   Wt 140 lb 8 oz (63.7 kg)   BMI 22.01 kg/m  , BMI Body mass index is 22.01 kg/m. GEN: Well nourished, well developed, in no acute distress  HEENT: normal  Neck: no JVD,  carotid bruits, or masses Cardiac: RRR; 8-8/8 systolic murmur radiating to the axilla , no  rubs, or gallops,no edema  Respiratory:  clear to auscultation bilaterally, normal work of breathing GI: soft, nontender, nondistended, + BS MS: no deformity or atrophy  Skin: warm and dry, no rash Neuro:  Strength and sensation are intact Psych: normal  EKG:  EKG is not ordered today.   Recent Labs: 10/22/2015: BUN 66; Creatinine, Ser 3.00; Platelets 181; Potassium 4.8; Sodium 140 06/10/2016: Hemoglobin 9.7    Lipid Panel No results found for: CHOL, TRIG, HDL, CHOLHDL, VLDL, LDLCALC, LDLDIRECT    Wt Readings from Last 3 Encounters:  06/20/16 140 lb 8 oz (63.7 kg)  02/29/16 150 lb (68 kg)  02/23/16 150 lb (68 kg)      Other studies Reviewed: Additional studies/ records that were reviewed today include: . Review of the above records demonstrates:    ASSESSMENT AND PLAN:  1.  Chronic systolic congestive heart failure:   Her symptoms have greatly improved. Continue current medications.  2. Mitral regurgitation: Her most recent echo suggested that she had moderate mitral regurgitation. She's been evaluated by her previous cardiologist and was told that she was not a candidate for mitral valve repair or replacement because of her renal dysfunction.  We may be able to consider her for mitral clip at some point.     I'll see her in 6 months.  Current medicines are reviewed at length with the patient today.  The patient does not have concerns regarding medicines.  Labs/ tests ordered today include:   No orders of the defined types were placed in this encounter.   Disposition:   FU with me in 6 months      Mertie Moores, MD  06/20/2016 8:47 AM    [facility] [place], [place], [place]  [address] Phone: [phone]; Fax: [phone]

## 2016-06-27 ENCOUNTER — Encounter (HOSPITAL_COMMUNITY)
Admission: RE | Admit: 2016-06-27 | Discharge: 2016-06-27 | Disposition: A | Discharge status: Home / Self Care | Payer: Medicare Other | Source: Ambulatory Visit | Attending: Nephrology | Admitting: Nephrology

## 2016-06-27 DIAGNOSIS — Z5181 Encounter for therapeutic drug level monitoring: Secondary | ICD-10-CM | POA: Diagnosis not present

## 2016-06-27 DIAGNOSIS — D631 Anemia in chronic kidney disease: Secondary | ICD-10-CM | POA: Insufficient documentation

## 2016-06-27 DIAGNOSIS — N183 Chronic kidney disease, stage 3 (moderate): Secondary | ICD-10-CM | POA: Insufficient documentation

## 2016-06-27 DIAGNOSIS — D638 Anemia in other chronic diseases classified elsewhere: Secondary | ICD-10-CM

## 2016-06-27 DIAGNOSIS — Z79899 Other long term (current) drug therapy: Secondary | ICD-10-CM | POA: Diagnosis not present

## 2016-06-27 LAB — POCT HEMOGLOBIN-HEMACUE: Hemoglobin: 10.4 g/dL — ABNORMAL LOW (ref 12.0–15.0)

## 2016-06-27 MED ORDER — EPOETIN ALFA 10000 UNIT/ML IJ SOLN
INTRAMUSCULAR | Status: AC
  Administered 2016-06-27: 10000 [IU] via SUBCUTANEOUS
  Filled 2016-06-27: qty 1

## 2016-06-27 MED ORDER — EPOETIN ALFA 10000 UNIT/ML IJ SOLN: 10000.0000 [IU] | INTRAMUSCULAR | Status: DC

## 2016-07-04 ENCOUNTER — Encounter (HOSPITAL_COMMUNITY)
Admission: RE | Admit: 2016-07-04 | Discharge: 2016-07-04 | Disposition: A | Discharge status: Home / Self Care | Payer: Medicare Other | Source: Ambulatory Visit | Attending: Nephrology | Admitting: Nephrology

## 2016-07-04 DIAGNOSIS — D638 Anemia in other chronic diseases classified elsewhere: Secondary | ICD-10-CM

## 2016-07-04 DIAGNOSIS — D631 Anemia in chronic kidney disease: Secondary | ICD-10-CM | POA: Diagnosis not present

## 2016-07-04 DIAGNOSIS — Z79899 Other long term (current) drug therapy: Secondary | ICD-10-CM | POA: Diagnosis not present

## 2016-07-04 DIAGNOSIS — Z5181 Encounter for therapeutic drug level monitoring: Secondary | ICD-10-CM | POA: Diagnosis not present

## 2016-07-04 DIAGNOSIS — N183 Chronic kidney disease, stage 3 (moderate): Secondary | ICD-10-CM | POA: Diagnosis not present

## 2016-07-04 LAB — POCT HEMOGLOBIN-HEMACUE: Hemoglobin: 10.3 g/dL — ABNORMAL LOW (ref 12.0–15.0)

## 2016-07-04 MED ORDER — EPOETIN ALFA 10000 UNIT/ML IJ SOLN
INTRAMUSCULAR | Status: AC
  Filled 2016-07-04: qty 1

## 2016-07-04 MED ORDER — EPOETIN ALFA 10000 UNIT/ML IJ SOLN
10000.0000 [IU] | INTRAMUSCULAR | Status: DC
  Administered 2016-07-04: 10000 [IU] via SUBCUTANEOUS

## 2016-07-10 ENCOUNTER — Encounter (HOSPITAL_COMMUNITY)
Admission: RE | Admit: 2016-07-10 | Discharge: 2016-07-10 | Disposition: A | Discharge status: Home / Self Care | Payer: Medicare Other | Source: Ambulatory Visit | Attending: Nephrology | Admitting: Nephrology

## 2016-07-10 DIAGNOSIS — N183 Chronic kidney disease, stage 3 (moderate): Secondary | ICD-10-CM | POA: Diagnosis not present

## 2016-07-10 DIAGNOSIS — D638 Anemia in other chronic diseases classified elsewhere: Secondary | ICD-10-CM

## 2016-07-10 DIAGNOSIS — Z79899 Other long term (current) drug therapy: Secondary | ICD-10-CM | POA: Diagnosis not present

## 2016-07-10 DIAGNOSIS — Z5181 Encounter for therapeutic drug level monitoring: Secondary | ICD-10-CM | POA: Diagnosis not present

## 2016-07-10 DIAGNOSIS — D631 Anemia in chronic kidney disease: Secondary | ICD-10-CM | POA: Diagnosis not present

## 2016-07-10 LAB — IRON AND TIBC
Iron: 24 ug/dL — ABNORMAL LOW (ref 28–170)
SATURATION RATIOS: 12 % (ref 10.4–31.8)
TIBC: 200 ug/dL — ABNORMAL LOW (ref 250–450)
UIBC: 176 ug/dL

## 2016-07-10 LAB — FERRITIN: Ferritin: 222 ng/mL (ref 11–307)

## 2016-07-10 LAB — POCT HEMOGLOBIN-HEMACUE: Hemoglobin: 11.5 g/dL — ABNORMAL LOW (ref 12.0–15.0)

## 2016-07-10 MED ORDER — EPOETIN ALFA 10000 UNIT/ML IJ SOLN
INTRAMUSCULAR | Status: AC
  Filled 2016-07-10: qty 1

## 2016-07-10 MED ORDER — EPOETIN ALFA 10000 UNIT/ML IJ SOLN
10000.0000 [IU] | INTRAMUSCULAR | Status: DC
Start: 2016-07-10 — End: 2016-07-11
  Administered 2016-07-10: 10:00:00 10000 [IU] via SUBCUTANEOUS

## 2016-07-18 ENCOUNTER — Encounter (HOSPITAL_COMMUNITY)
Admission: RE | Admit: 2016-07-18 | Discharge: 2016-07-18 | Disposition: A | Discharge status: Home / Self Care | Payer: Medicare Other | Source: Ambulatory Visit | Attending: Nephrology | Admitting: Nephrology

## 2016-07-18 DIAGNOSIS — N183 Chronic kidney disease, stage 3 (moderate): Secondary | ICD-10-CM | POA: Diagnosis not present

## 2016-07-18 DIAGNOSIS — Z5181 Encounter for therapeutic drug level monitoring: Secondary | ICD-10-CM | POA: Diagnosis not present

## 2016-07-18 DIAGNOSIS — Z79899 Other long term (current) drug therapy: Secondary | ICD-10-CM | POA: Diagnosis not present

## 2016-07-18 DIAGNOSIS — D638 Anemia in other chronic diseases classified elsewhere: Secondary | ICD-10-CM

## 2016-07-18 DIAGNOSIS — D631 Anemia in chronic kidney disease: Secondary | ICD-10-CM | POA: Diagnosis not present

## 2016-07-18 LAB — POCT HEMOGLOBIN-HEMACUE: HEMOGLOBIN: 10.8 g/dL — AB (ref 12.0–15.0)

## 2016-07-18 MED ORDER — EPOETIN ALFA 10000 UNIT/ML IJ SOLN
INTRAMUSCULAR | Status: AC
  Filled 2016-07-18: qty 1

## 2016-07-18 MED ORDER — EPOETIN ALFA 10000 UNIT/ML IJ SOLN
10000.0000 [IU] | INTRAMUSCULAR | Status: DC
  Administered 2016-07-18: 10:00:00 10000 [IU] via SUBCUTANEOUS

## 2016-07-25 ENCOUNTER — Encounter (HOSPITAL_COMMUNITY)
Admission: RE | Admit: 2016-07-25 | Discharge: 2016-07-25 | Disposition: A | Discharge status: Home / Self Care | Payer: Medicare Other | Source: Ambulatory Visit | Attending: Nephrology | Admitting: Nephrology

## 2016-07-25 DIAGNOSIS — Z5181 Encounter for therapeutic drug level monitoring: Secondary | ICD-10-CM | POA: Insufficient documentation

## 2016-07-25 DIAGNOSIS — D631 Anemia in chronic kidney disease: Secondary | ICD-10-CM | POA: Insufficient documentation

## 2016-07-25 DIAGNOSIS — N183 Chronic kidney disease, stage 3 (moderate): Secondary | ICD-10-CM | POA: Diagnosis not present

## 2016-07-25 DIAGNOSIS — Z79899 Other long term (current) drug therapy: Secondary | ICD-10-CM | POA: Diagnosis not present

## 2016-07-25 DIAGNOSIS — D638 Anemia in other chronic diseases classified elsewhere: Secondary | ICD-10-CM

## 2016-07-25 LAB — POCT HEMOGLOBIN-HEMACUE: Hemoglobin: 10.8 g/dL — ABNORMAL LOW (ref 12.0–15.0)

## 2016-07-25 MED ORDER — EPOETIN ALFA 10000 UNIT/ML IJ SOLN
10000.0000 [IU] | INTRAMUSCULAR | Status: DC
  Administered 2016-07-25: 10:00:00 10000 [IU] via SUBCUTANEOUS

## 2016-07-25 MED ORDER — EPOETIN ALFA 10000 UNIT/ML IJ SOLN
INTRAMUSCULAR | Status: AC
  Filled 2016-07-25: qty 1

## 2016-08-01 ENCOUNTER — Encounter (HOSPITAL_COMMUNITY)
Admission: RE | Admit: 2016-08-01 | Discharge: 2016-08-01 | Disposition: A | Discharge status: Home / Self Care | Payer: Medicare Other | Source: Ambulatory Visit | Attending: Nephrology | Admitting: Nephrology

## 2016-08-01 DIAGNOSIS — D631 Anemia in chronic kidney disease: Secondary | ICD-10-CM | POA: Diagnosis not present

## 2016-08-01 DIAGNOSIS — Z5181 Encounter for therapeutic drug level monitoring: Secondary | ICD-10-CM | POA: Diagnosis not present

## 2016-08-01 DIAGNOSIS — N183 Chronic kidney disease, stage 3 (moderate): Secondary | ICD-10-CM | POA: Diagnosis not present

## 2016-08-01 DIAGNOSIS — D638 Anemia in other chronic diseases classified elsewhere: Secondary | ICD-10-CM

## 2016-08-01 DIAGNOSIS — Z79899 Other long term (current) drug therapy: Secondary | ICD-10-CM | POA: Diagnosis not present

## 2016-08-01 LAB — POCT HEMOGLOBIN-HEMACUE: Hemoglobin: 10.9 g/dL — ABNORMAL LOW (ref 12.0–15.0)

## 2016-08-01 MED ORDER — EPOETIN ALFA 10000 UNIT/ML IJ SOLN
INTRAMUSCULAR | Status: AC
  Filled 2016-08-01: qty 1

## 2016-08-01 MED ORDER — EPOETIN ALFA 10000 UNIT/ML IJ SOLN
10000.0000 [IU] | INTRAMUSCULAR | Status: DC
  Administered 2016-08-01: 10:00:00 10000 [IU] via SUBCUTANEOUS

## 2016-08-07 ENCOUNTER — Inpatient Hospital Stay (HOSPITAL_COMMUNITY)
Admission: RE | Admit: 2016-08-07 | Discharge: 2016-08-07 | Disposition: A | Discharge status: Home / Self Care | Payer: Medicare Other | Source: Ambulatory Visit | Attending: Nephrology | Admitting: Nephrology

## 2016-08-22 ENCOUNTER — Encounter (HOSPITAL_COMMUNITY)
Admission: RE | Admit: 2016-08-22 | Discharge: 2016-08-22 | Disposition: A | Discharge status: Home / Self Care | Payer: Medicare Other | Source: Ambulatory Visit | Attending: Nephrology | Admitting: Nephrology

## 2016-08-22 DIAGNOSIS — Z79899 Other long term (current) drug therapy: Secondary | ICD-10-CM | POA: Diagnosis not present

## 2016-08-22 DIAGNOSIS — Z5181 Encounter for therapeutic drug level monitoring: Secondary | ICD-10-CM | POA: Diagnosis not present

## 2016-08-22 DIAGNOSIS — D631 Anemia in chronic kidney disease: Secondary | ICD-10-CM | POA: Diagnosis not present

## 2016-08-22 DIAGNOSIS — D638 Anemia in other chronic diseases classified elsewhere: Secondary | ICD-10-CM

## 2016-08-22 DIAGNOSIS — N183 Chronic kidney disease, stage 3 (moderate): Secondary | ICD-10-CM | POA: Diagnosis not present

## 2016-08-22 LAB — POCT HEMOGLOBIN-HEMACUE: Hemoglobin: 10.5 g/dL — ABNORMAL LOW (ref 12.0–15.0)

## 2016-08-22 LAB — IRON AND TIBC
IRON: 39 ug/dL (ref 28–170)
SATURATION RATIOS: 19 % (ref 10.4–31.8)
TIBC: 210 ug/dL — AB (ref 250–450)
UIBC: 171 ug/dL

## 2016-08-22 LAB — FERRITIN: Ferritin: 280 ng/mL (ref 11–307)

## 2016-08-22 MED ORDER — EPOETIN ALFA 10000 UNIT/ML IJ SOLN
INTRAMUSCULAR | Status: AC
  Filled 2016-08-22: qty 1

## 2016-08-22 MED ORDER — EPOETIN ALFA 10000 UNIT/ML IJ SOLN
10000.0000 [IU] | INTRAMUSCULAR | Status: DC
  Administered 2016-08-22: 10:00:00 10000 [IU] via SUBCUTANEOUS

## 2016-08-29 ENCOUNTER — Encounter (HOSPITAL_COMMUNITY)
Admission: RE | Admit: 2016-08-29 | Discharge: 2016-08-29 | Disposition: A | Discharge status: Home / Self Care | Payer: Medicare Other | Source: Ambulatory Visit | Attending: Nephrology | Admitting: Nephrology

## 2016-08-29 DIAGNOSIS — N183 Chronic kidney disease, stage 3 (moderate): Secondary | ICD-10-CM | POA: Diagnosis not present

## 2016-08-29 DIAGNOSIS — Z79899 Other long term (current) drug therapy: Secondary | ICD-10-CM | POA: Insufficient documentation

## 2016-08-29 DIAGNOSIS — Z5181 Encounter for therapeutic drug level monitoring: Secondary | ICD-10-CM | POA: Insufficient documentation

## 2016-08-29 DIAGNOSIS — D631 Anemia in chronic kidney disease: Secondary | ICD-10-CM | POA: Diagnosis not present

## 2016-08-29 DIAGNOSIS — D638 Anemia in other chronic diseases classified elsewhere: Secondary | ICD-10-CM

## 2016-08-29 LAB — POCT HEMOGLOBIN-HEMACUE: Hemoglobin: 9.5 g/dL — ABNORMAL LOW (ref 12.0–15.0)

## 2016-08-29 MED ORDER — EPOETIN ALFA 10000 UNIT/ML IJ SOLN
INTRAMUSCULAR | Status: AC
  Administered 2016-08-29: 10000 [IU] via SUBCUTANEOUS
  Filled 2016-08-29: qty 1

## 2016-08-29 MED ORDER — EPOETIN ALFA 10000 UNIT/ML IJ SOLN: 10000.0000 [IU] | INTRAMUSCULAR | Status: DC

## 2016-09-05 ENCOUNTER — Encounter (HOSPITAL_COMMUNITY)
Admission: RE | Admit: 2016-09-05 | Discharge: 2016-09-05 | Disposition: A | Discharge status: Home / Self Care | Payer: Medicare Other | Source: Ambulatory Visit | Attending: Nephrology | Admitting: Nephrology

## 2016-09-05 DIAGNOSIS — Z79899 Other long term (current) drug therapy: Secondary | ICD-10-CM | POA: Diagnosis not present

## 2016-09-05 DIAGNOSIS — D631 Anemia in chronic kidney disease: Secondary | ICD-10-CM | POA: Diagnosis not present

## 2016-09-05 DIAGNOSIS — D638 Anemia in other chronic diseases classified elsewhere: Secondary | ICD-10-CM

## 2016-09-05 DIAGNOSIS — Z5181 Encounter for therapeutic drug level monitoring: Secondary | ICD-10-CM | POA: Diagnosis not present

## 2016-09-05 DIAGNOSIS — N183 Chronic kidney disease, stage 3 (moderate): Secondary | ICD-10-CM | POA: Diagnosis not present

## 2016-09-05 LAB — POCT HEMOGLOBIN-HEMACUE: HEMOGLOBIN: 9.8 g/dL — AB (ref 12.0–15.0)

## 2016-09-05 MED ORDER — EPOETIN ALFA 10000 UNIT/ML IJ SOLN
10000.0000 [IU] | INTRAMUSCULAR | Status: DC
  Administered 2016-09-05: 10000 [IU] via SUBCUTANEOUS

## 2016-09-05 MED ORDER — EPOETIN ALFA 10000 UNIT/ML IJ SOLN
INTRAMUSCULAR | Status: AC
  Administered 2016-09-05: 09:00:00 10000 [IU] via SUBCUTANEOUS
  Filled 2016-09-05: qty 1

## 2016-09-12 ENCOUNTER — Encounter (HOSPITAL_COMMUNITY): Payer: Medicare Other

## 2016-09-19 ENCOUNTER — Encounter (HOSPITAL_COMMUNITY)
Admission: RE | Admit: 2016-09-19 | Discharge: 2016-09-19 | Disposition: A | Discharge status: Home / Self Care | Payer: Medicare Other | Source: Ambulatory Visit | Attending: Nephrology | Admitting: Nephrology

## 2016-09-19 DIAGNOSIS — D638 Anemia in other chronic diseases classified elsewhere: Secondary | ICD-10-CM

## 2016-09-19 DIAGNOSIS — N183 Chronic kidney disease, stage 3 (moderate): Secondary | ICD-10-CM | POA: Diagnosis not present

## 2016-09-19 DIAGNOSIS — Z5181 Encounter for therapeutic drug level monitoring: Secondary | ICD-10-CM | POA: Diagnosis not present

## 2016-09-19 DIAGNOSIS — D631 Anemia in chronic kidney disease: Secondary | ICD-10-CM | POA: Diagnosis not present

## 2016-09-19 DIAGNOSIS — Z79899 Other long term (current) drug therapy: Secondary | ICD-10-CM | POA: Diagnosis not present

## 2016-09-19 LAB — FERRITIN: FERRITIN: 249 ng/mL (ref 11–307)

## 2016-09-19 LAB — IRON AND TIBC
IRON: 37 ug/dL (ref 28–170)
Saturation Ratios: 17 % (ref 10.4–31.8)
TIBC: 224 ug/dL — AB (ref 250–450)
UIBC: 187 ug/dL

## 2016-09-19 LAB — POCT HEMOGLOBIN-HEMACUE: HEMOGLOBIN: 10.6 g/dL — AB (ref 12.0–15.0)

## 2016-09-19 MED ORDER — EPOETIN ALFA 10000 UNIT/ML IJ SOLN: 10000.0000 [IU] | INTRAMUSCULAR | Status: DC

## 2016-09-19 MED ORDER — EPOETIN ALFA 10000 UNIT/ML IJ SOLN
INTRAMUSCULAR | Status: AC
  Administered 2016-09-19: 11:00:00 10000 [IU]
  Filled 2016-09-19: qty 1

## 2016-09-20 DIAGNOSIS — N189 Chronic kidney disease, unspecified: Secondary | ICD-10-CM | POA: Diagnosis not present

## 2016-09-20 DIAGNOSIS — I509 Heart failure, unspecified: Secondary | ICD-10-CM | POA: Diagnosis not present

## 2016-09-20 DIAGNOSIS — I10 Essential (primary) hypertension: Secondary | ICD-10-CM | POA: Diagnosis not present

## 2016-09-20 DIAGNOSIS — D649 Anemia, unspecified: Secondary | ICD-10-CM | POA: Diagnosis not present

## 2016-09-26 ENCOUNTER — Inpatient Hospital Stay (HOSPITAL_COMMUNITY): Admission: RE | Admit: 2016-09-26 | Payer: Medicare Other | Source: Ambulatory Visit

## 2016-10-11 DIAGNOSIS — N189 Chronic kidney disease, unspecified: Secondary | ICD-10-CM | POA: Diagnosis not present

## 2016-10-11 DIAGNOSIS — I129 Hypertensive chronic kidney disease with stage 1 through stage 4 chronic kidney disease, or unspecified chronic kidney disease: Secondary | ICD-10-CM | POA: Diagnosis not present

## 2016-10-11 DIAGNOSIS — N2581 Secondary hyperparathyroidism of renal origin: Secondary | ICD-10-CM | POA: Diagnosis not present

## 2016-11-03 IMAGING — US US RENAL
1 series · 13 of 25 positions shown · non-contrast
Comparison: None.

CLINICAL DATA: Chronic kidney disease.

EXAM:
RENAL / URINARY TRACT ULTRASOUND COMPLETE

[Series 1: us renal · 0.22mm/px · 13 of 29 slices shown]
[im 1/29]
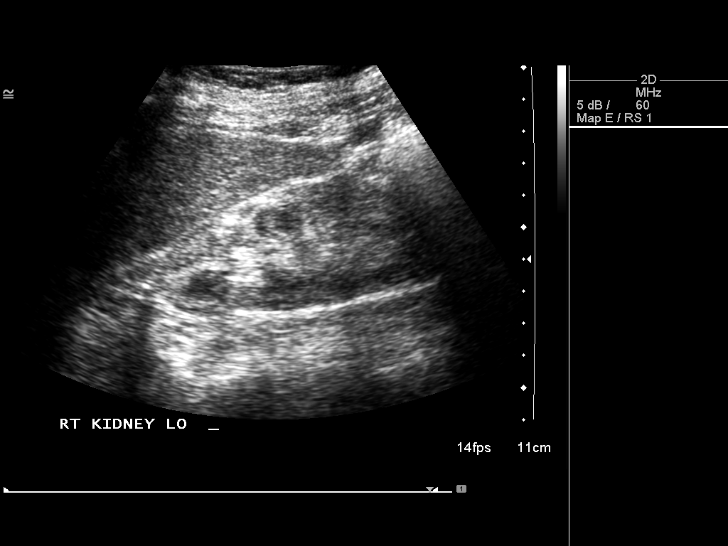
[im 3/29]
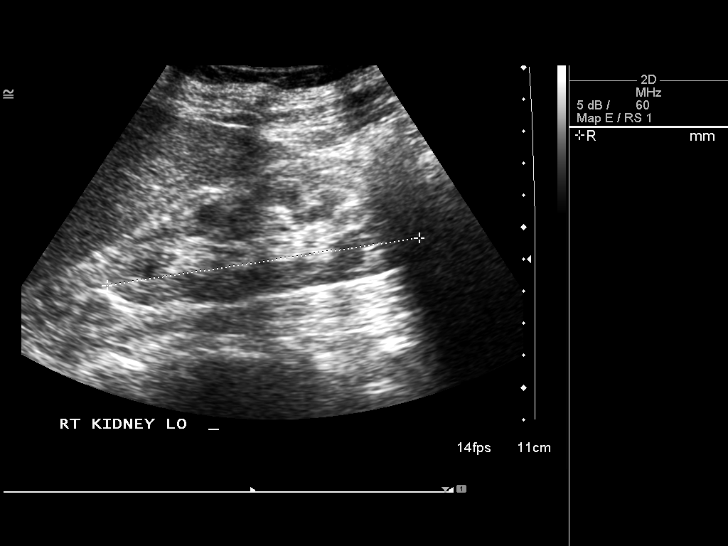
[im 5/29]
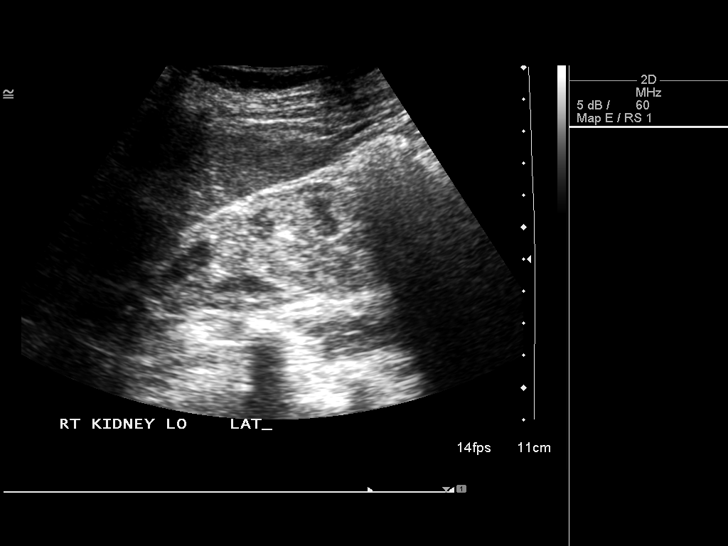
[im 8/29]
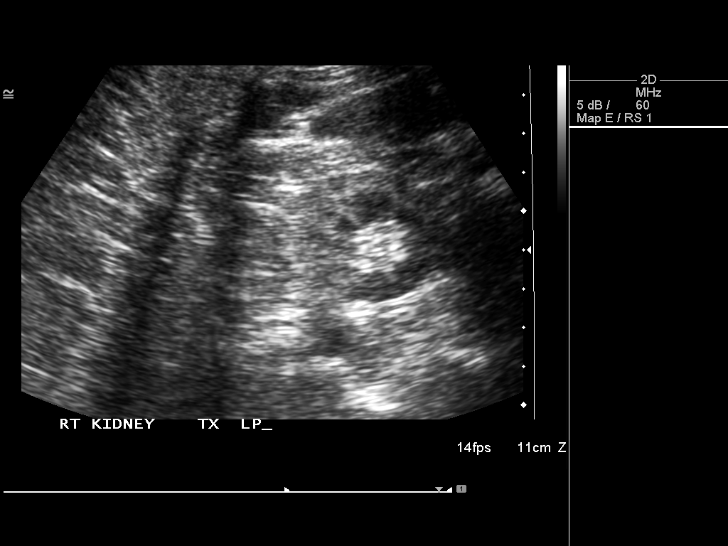
[im 10/29]
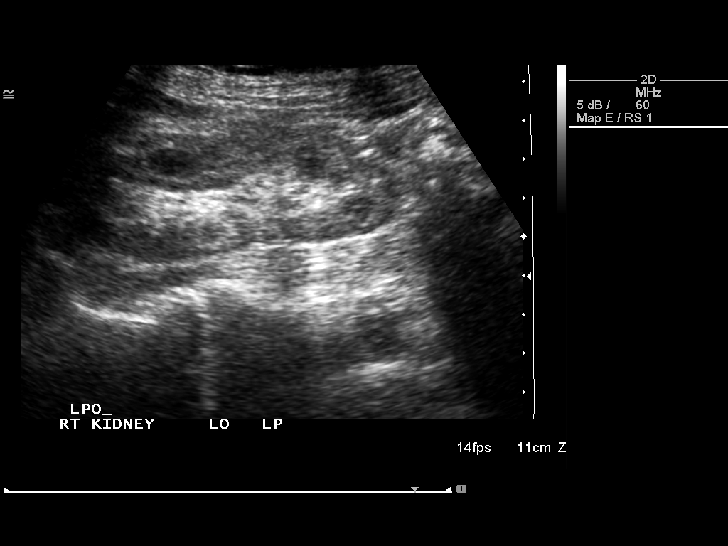
[im 12/29]
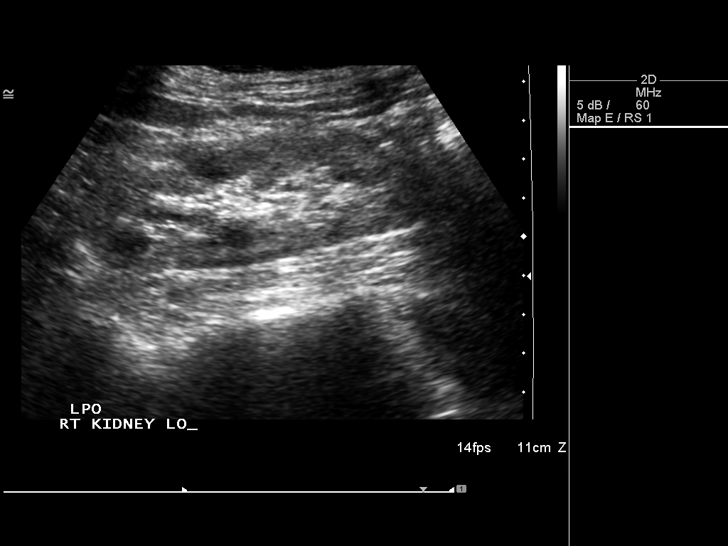
[im 15/29]
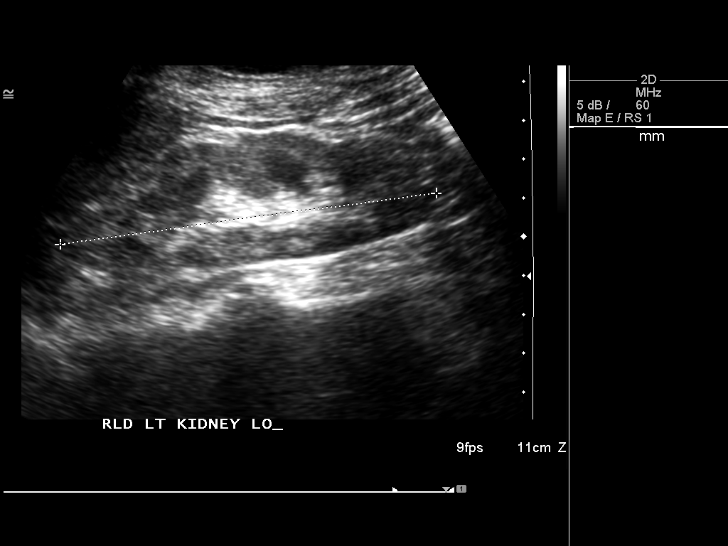
[im 17/29]
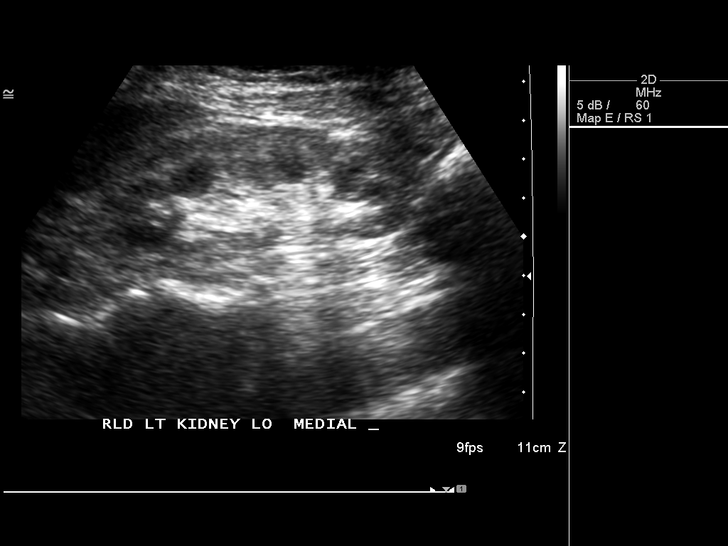
[im 19/29]
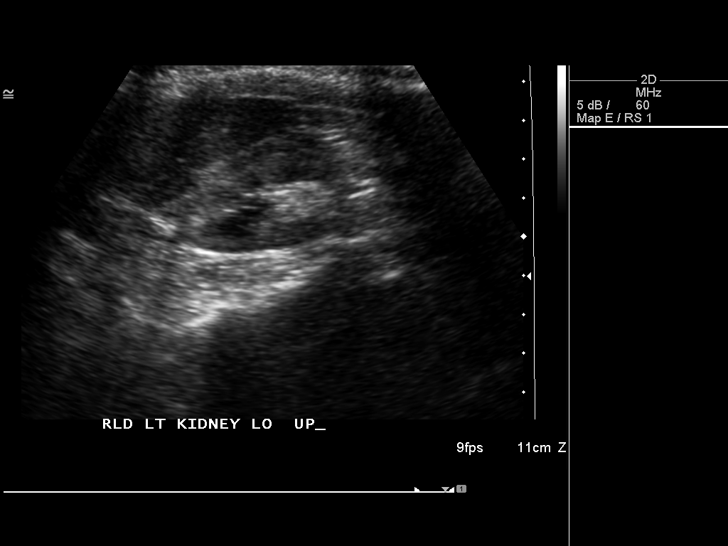
[im 22/29]
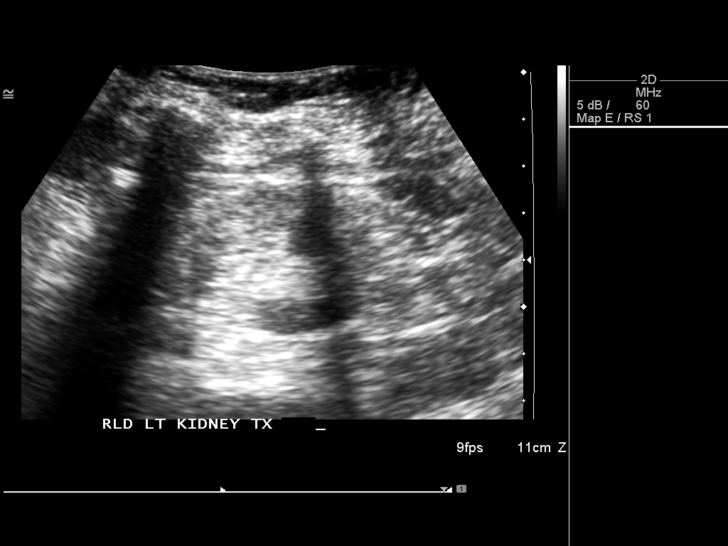
[im 24/29]
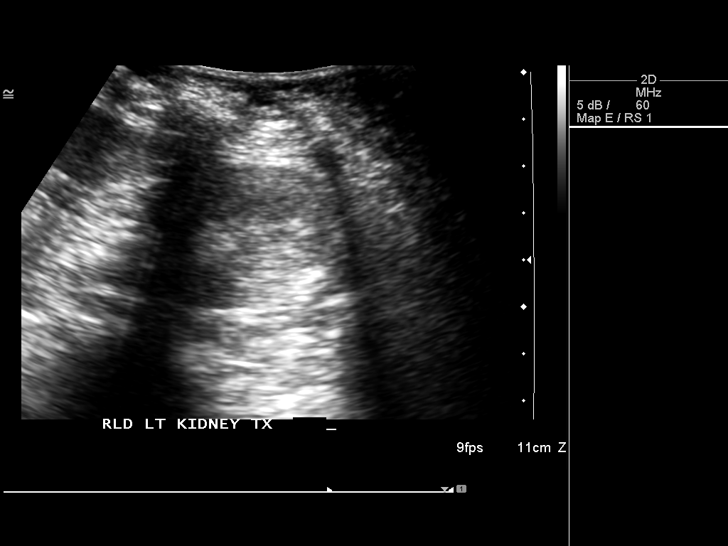
[im 26/29]
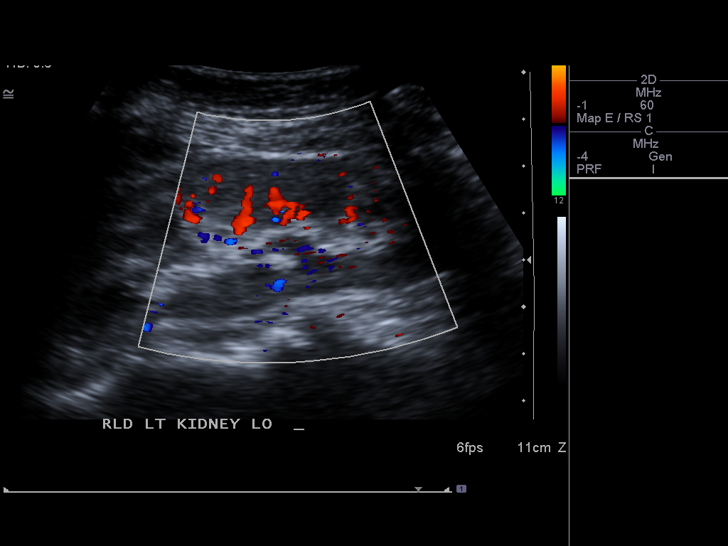
[im 29/29]
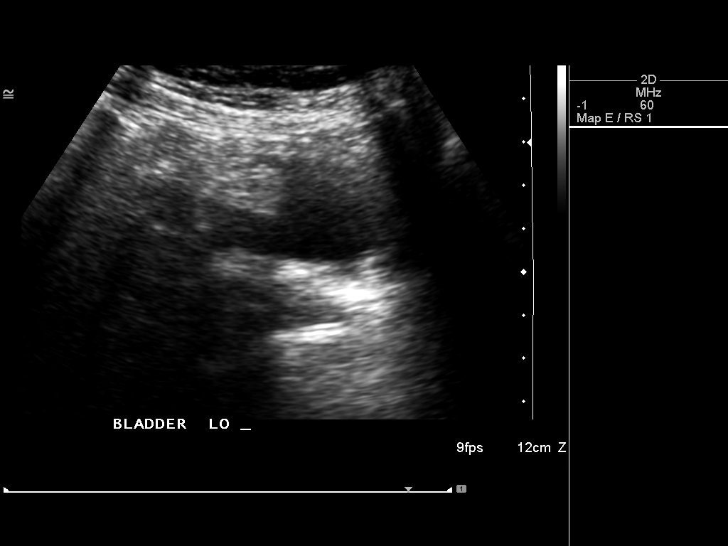

[13 of 25 positions shown; findings below may reference images not displayed]

FINDINGS: Right Kidney:

Length: 9.9 cm. Diffuse increased echogenicity with thinning of the
renal cortex. No mass lesions. No hydronephrosis.

Left Kidney:

Length: 9.8 cm. Diffuse renal cortical thinning. No mass lesions or
hydronephrosis.. Echogenicity within normal limits. No mass or
hydronephrosis visualized.

Bladder:

There is an area of what appears to be abnormal focal thickening of
the anterior wall of the bladder, best seen on images 3 through 7 of
17. This is a separate series. There is also a large partially
calcified inhomogeneous mass in the pelvis, indeterminate. This
could represent a fibroid but is incompletely visualized on this
urinary tract ultrasound.
IMPRESSION: 1. Slight bilateral renal atrophy with thinning of the renal cortex
and increased echogenicity consistent with renal medical disease.
2. Persistent area of focal thickening of the anterior bladder wall.
The possibility of a bladder wall mass should be considered.
3. Inhomogeneous mass in the pelvis, possibly a calcified uterine
fibroid but the appearance on this this urinary tract ultrasound is
nonspecific.

## 2017-01-09 DIAGNOSIS — I10 Essential (primary) hypertension: Secondary | ICD-10-CM | POA: Diagnosis not present

## 2017-01-09 DIAGNOSIS — I509 Heart failure, unspecified: Secondary | ICD-10-CM | POA: Diagnosis not present

## 2017-01-09 DIAGNOSIS — R7309 Other abnormal glucose: Secondary | ICD-10-CM | POA: Diagnosis not present

## 2017-01-09 DIAGNOSIS — N189 Chronic kidney disease, unspecified: Secondary | ICD-10-CM | POA: Diagnosis not present

## 2017-01-09 DIAGNOSIS — Z0000 Encounter for general adult medical examination without abnormal findings: Secondary | ICD-10-CM | POA: Diagnosis not present

## 2017-01-09 DIAGNOSIS — Z136 Encounter for screening for cardiovascular disorders: Secondary | ICD-10-CM | POA: Diagnosis not present

## 2017-02-05 ENCOUNTER — Other Ambulatory Visit (HOSPITAL_COMMUNITY): Payer: Self-pay | Admitting: *Deleted

## 2017-02-06 ENCOUNTER — Encounter (HOSPITAL_COMMUNITY): Payer: Medicare Other

## 2017-02-19 ENCOUNTER — Encounter: Payer: Self-pay | Admitting: Cardiovascular Disease

## 2017-02-20 ENCOUNTER — Encounter (HOSPITAL_COMMUNITY): Payer: Medicare Other

## 2017-02-28 ENCOUNTER — Encounter: Payer: Self-pay | Admitting: Cardiovascular Disease

## 2017-02-28 ENCOUNTER — Ambulatory Visit (INDEPENDENT_AMBULATORY_CARE_PROVIDER_SITE_OTHER): Payer: Medicare Other | Admitting: Cardiovascular Disease

## 2017-02-28 VITALS — BP 132/80 | HR 58 | Resp 16 | Ht 67.0 in | Wt 142.0 lb

## 2017-02-28 DIAGNOSIS — I5022 Chronic systolic (congestive) heart failure: Secondary | ICD-10-CM | POA: Diagnosis not present

## 2017-02-28 DIAGNOSIS — I340 Nonrheumatic mitral (valve) insufficiency: Secondary | ICD-10-CM | POA: Diagnosis not present

## 2017-02-28 NOTE — Progress Notes (Signed)
Cardiology Office Note   Date:  02/28/2017   ID:  Julia [name], DOB 29-Dec-1940, MRN [id number]  PCP:  London Pepper, MD  Cardiologist:   Mertie Moores, MD   Chief Complaint  Patient presents with  . Follow-up    CHF   Problem List:  1. Essential HTN 2. Chronic systolic CHF  - EF 17% -61% 3. Chronic renal disease - stage III 4. Severe MR by TEE    History of Present Illness:  Seen with granddaughter, Julia [name] is a 76 y.o. female who presents for eval of CHF and severe MR .  Recently moved from [place].   She's been treated with medicines and feels quite a bit better. She's not had any surgical repair.     No acute issues  Potassium was elevated recenly and her Aldactone , Ramipril were stopped.  Fairly active for 76 years.  Does her own shopping and housework .   Sleeps with 2 pillows.   Not particularly short of breat with 1 pillow  Had significant CHF in 2015 while in [place] ( no dyspnea, just leg edema )  She originally presented with congestive heart failure. Evaluation revealed that she had normal coronary arteries. She was found have severe mitral regurgitation due to a flail leaflet of the mitral valve. She was also found to have anemia in chronic kidney disease.   Jan. 25, 2018:  Julia [name] is seen today for follow up visit Feeling well,  Echo showed moderate MR. LV function   Oct. 5, 2018:  Julia [name] is seen for follow up of her CHF and moderate MR .  Seen with Mateo Flow, Able to do all her normal activities  Walks regularly - about 45 minutes , no severe dyspnea  No syncope   Past Medical History:  Diagnosis Date  . CHF (congestive heart failure) ([place])   . Hypertension     No past surgical history on file.   Current Outpatient Prescriptions  Medication Sig Dispense Refill  . amLODipine (NORVASC) 2.5 MG tablet Take 2.5 mg by mouth daily.    . carvedilol (COREG) 25 MG tablet Take 25 mg by mouth 2 (two) times daily.  3   No current  facility-administered medications for this visit.     Allergies:   Penicillins    Social History:  The patient  reports that she has quit smoking. She has never used smokeless tobacco. She reports that she does not drink alcohol or use drugs.   Family History:  The patient's family history includes Alcohol abuse in her brother; Breast cancer in her sister; Cancer in her mother; Congestive Heart Failure in her sister; Diabetes in her sister; Diverticulitis in her brother; Gout in her sister; Healthy in her brother, brother, and daughter; Hypertension in her father and sister; Kidney disease in her sister; Lung disease in her sister; Stroke in her father.    ROS:  Please see the history of present illness.    Review of Systems: Constitutional:  denies fever, chills, diaphoresis, appetite change and fatigue.  HEENT: denies photophobia, eye pain, redness, hearing loss, ear pain, congestion, sore throat, rhinorrhea, sneezing, neck pain, neck stiffness and tinnitus.  Respiratory: denies SOB, DOE, cough, chest tightness, and wheezing.  Cardiovascular: denies chest pain, palpitations and leg swelling.  Gastrointestinal: denies nausea, vomiting, abdominal pain, diarrhea, constipation, blood in stool.  Genitourinary: denies dysuria, urgency, frequency, hematuria, flank pain and difficulty urinating.  Musculoskeletal: denies  myalgias, back pain, joint swelling, arthralgias  and gait problem.   Skin: denies pallor, rash and wound.  Neurological: denies dizziness, seizures, syncope, weakness, light-headedness, numbness and headaches.   Hematological: denies adenopathy, easy bruising, personal or family bleeding history.  Psychiatric/ Behavioral: denies suicidal ideation, mood changes, confusion, nervousness, sleep disturbance and agitation.       All other systems are reviewed and negative.    EKG:  EKG is ordered today. Oct. 5, 2018:   NSR at 62.  RBBB .   Recent Labs: 09/19/2016: Hemoglobin  10.6    Lipid Panel No results found for: CHOL, TRIG, HDL, CHOLHDL, VLDL, LDLCALC, LDLDIRECT    Wt Readings from Last 3 Encounters:  02/28/17 142 lb (64.4 kg)  06/20/16 140 lb 8 oz (63.7 kg)  02/29/16 150 lb (68 kg)      Other studies Reviewed: Additional studies/ records that were reviewed today include: . Review of the above records demonstrates:    ASSESSMENT AND PLAN:  1.  Chronic systolic congestive heart failure:   She seems to be doing well. She has not required any Lasix. Continue current medications  2. Mitral regurgitation: stable  mitral regurgitation  3. CKD - most recent Creatinine is 1.65.  Sees Dr. Justin Mend.     I'll see her in 6 months.  Current medicines are reviewed at length with the patient today.  The patient does not have concerns regarding medicines.  Labs/ tests ordered today include:   No orders of the defined types were placed in this encounter.    Mertie Moores, MD  02/28/2017 8:14 AM    [facility] [place], [place], [place]  [address] Phone: [phone]; Fax: [phone]

## 2017-02-28 NOTE — Patient Instructions (Signed)

## 2017-03-07 ENCOUNTER — Encounter (HOSPITAL_COMMUNITY)
Admission: RE | Admit: 2017-03-07 | Discharge: 2017-03-07 | Disposition: A | Discharge status: Home / Self Care | Payer: Medicare Other | Source: Ambulatory Visit | Attending: Nephrology | Admitting: Nephrology

## 2017-03-07 DIAGNOSIS — N183 Chronic kidney disease, stage 3 (moderate): Secondary | ICD-10-CM | POA: Diagnosis not present

## 2017-03-07 DIAGNOSIS — D631 Anemia in chronic kidney disease: Secondary | ICD-10-CM | POA: Insufficient documentation

## 2017-03-07 DIAGNOSIS — D638 Anemia in other chronic diseases classified elsewhere: Secondary | ICD-10-CM

## 2017-03-07 LAB — IRON AND TIBC
IRON: 28 ug/dL (ref 28–170)
Saturation Ratios: 13 % (ref 10.4–31.8)
TIBC: 220 ug/dL — AB (ref 250–450)
UIBC: 192 ug/dL

## 2017-03-07 LAB — FERRITIN: FERRITIN: 338 ng/mL — AB (ref 11–307)

## 2017-03-07 LAB — POCT HEMOGLOBIN-HEMACUE: HEMOGLOBIN: 8.7 g/dL — AB (ref 12.0–15.0)

## 2017-03-07 MED ORDER — EPOETIN ALFA 10000 UNIT/ML IJ SOLN
INTRAMUSCULAR | Status: AC
  Administered 2017-03-07: 12:00:00 10000 [IU] via SUBCUTANEOUS
  Filled 2017-03-07: qty 1

## 2017-03-07 MED ORDER — EPOETIN ALFA 10000 UNIT/ML IJ SOLN
10000.0000 [IU] | INTRAMUSCULAR | Status: DC
  Administered 2017-03-07: 10000 [IU] via SUBCUTANEOUS

## 2017-03-14 ENCOUNTER — Encounter (HOSPITAL_COMMUNITY)
Admission: RE | Admit: 2017-03-14 | Discharge: 2017-03-14 | Disposition: A | Discharge status: Home / Self Care | Payer: Medicare Other | Source: Ambulatory Visit | Attending: Nephrology | Admitting: Nephrology

## 2017-03-14 DIAGNOSIS — D638 Anemia in other chronic diseases classified elsewhere: Secondary | ICD-10-CM

## 2017-03-14 DIAGNOSIS — D631 Anemia in chronic kidney disease: Secondary | ICD-10-CM | POA: Diagnosis not present

## 2017-03-14 DIAGNOSIS — N183 Chronic kidney disease, stage 3 (moderate): Secondary | ICD-10-CM | POA: Diagnosis not present

## 2017-03-14 LAB — POCT HEMOGLOBIN-HEMACUE: Hemoglobin: 8.8 g/dL — ABNORMAL LOW (ref 12.0–15.0)

## 2017-03-14 MED ORDER — EPOETIN ALFA 10000 UNIT/ML IJ SOLN
10000.0000 [IU] | INTRAMUSCULAR | Status: DC
Start: 2017-03-14 — End: 2017-03-15

## 2017-03-14 MED ORDER — EPOETIN ALFA 10000 UNIT/ML IJ SOLN
INTRAMUSCULAR | Status: AC
  Administered 2017-03-14: 10000 [IU]
  Filled 2017-03-14: qty 1

## 2017-03-20 ENCOUNTER — Encounter (HOSPITAL_COMMUNITY)
Admission: RE | Admit: 2017-03-20 | Discharge: 2017-03-20 | Disposition: A | Discharge status: Home / Self Care | Payer: Medicare Other | Source: Ambulatory Visit | Attending: Nephrology | Admitting: Nephrology

## 2017-03-20 DIAGNOSIS — D631 Anemia in chronic kidney disease: Secondary | ICD-10-CM | POA: Diagnosis not present

## 2017-03-20 DIAGNOSIS — N183 Chronic kidney disease, stage 3 (moderate): Secondary | ICD-10-CM | POA: Diagnosis not present

## 2017-03-20 DIAGNOSIS — D638 Anemia in other chronic diseases classified elsewhere: Secondary | ICD-10-CM

## 2017-03-20 LAB — POCT HEMOGLOBIN-HEMACUE: HEMOGLOBIN: 9.8 g/dL — AB (ref 12.0–15.0)

## 2017-03-20 MED ORDER — EPOETIN ALFA 10000 UNIT/ML IJ SOLN
10000.0000 [IU] | INTRAMUSCULAR | Status: DC
Start: 2017-03-20 — End: 2017-03-21

## 2017-03-20 MED ORDER — EPOETIN ALFA 10000 UNIT/ML IJ SOLN
INTRAMUSCULAR | Status: AC
  Administered 2017-03-20: 14:00:00 10000 [IU]
  Filled 2017-03-20: qty 1

## 2017-03-27 ENCOUNTER — Encounter (HOSPITAL_COMMUNITY): Payer: Medicare Other

## 2017-04-10 ENCOUNTER — Inpatient Hospital Stay (HOSPITAL_COMMUNITY)
Admission: RE | Admit: 2017-04-10 | Discharge: 2017-04-10 | Disposition: A | Discharge status: Home / Self Care | Payer: Medicare Other | Source: Ambulatory Visit | Attending: Nephrology | Admitting: Nephrology

## 2017-04-24 DIAGNOSIS — I10 Essential (primary) hypertension: Secondary | ICD-10-CM | POA: Diagnosis not present

## 2017-04-24 DIAGNOSIS — R7303 Prediabetes: Secondary | ICD-10-CM | POA: Diagnosis not present

## 2017-04-24 DIAGNOSIS — D649 Anemia, unspecified: Secondary | ICD-10-CM | POA: Diagnosis not present

## 2017-04-24 DIAGNOSIS — N189 Chronic kidney disease, unspecified: Secondary | ICD-10-CM | POA: Diagnosis not present

## 2017-04-24 DIAGNOSIS — I509 Heart failure, unspecified: Secondary | ICD-10-CM | POA: Diagnosis not present

## 2017-06-04 ENCOUNTER — Other Ambulatory Visit (HOSPITAL_COMMUNITY): Payer: Self-pay | Admitting: *Deleted

## 2017-06-05 ENCOUNTER — Encounter (HOSPITAL_COMMUNITY)
Admission: RE | Admit: 2017-06-05 | Discharge: 2017-06-05 | Disposition: A | Discharge status: Home / Self Care | Payer: Medicare Other | Source: Ambulatory Visit | Attending: Nephrology | Admitting: Nephrology

## 2017-06-05 VITALS — BP 163/81 | HR 64 | Temp 97.5°F | Resp 20

## 2017-06-05 DIAGNOSIS — D638 Anemia in other chronic diseases classified elsewhere: Secondary | ICD-10-CM

## 2017-06-05 DIAGNOSIS — N183 Chronic kidney disease, stage 3 (moderate): Secondary | ICD-10-CM | POA: Diagnosis not present

## 2017-06-05 DIAGNOSIS — D631 Anemia in chronic kidney disease: Secondary | ICD-10-CM | POA: Insufficient documentation

## 2017-06-05 LAB — POCT HEMOGLOBIN-HEMACUE: Hemoglobin: 9.4 g/dL — ABNORMAL LOW (ref 12.0–15.0)

## 2017-06-05 LAB — IRON AND TIBC
Iron: 38 ug/dL (ref 28–170)
SATURATION RATIOS: 16 % (ref 10.4–31.8)
TIBC: 238 ug/dL — ABNORMAL LOW (ref 250–450)
UIBC: 200 ug/dL

## 2017-06-05 LAB — FERRITIN: Ferritin: 286 ng/mL (ref 11–307)

## 2017-06-05 MED ORDER — EPOETIN ALFA 10000 UNIT/ML IJ SOLN
10000.0000 [IU] | INTRAMUSCULAR | Status: DC
  Administered 2017-06-05: 10000 [IU] via SUBCUTANEOUS

## 2017-06-05 MED ORDER — EPOETIN ALFA 10000 UNIT/ML IJ SOLN
INTRAMUSCULAR | Status: AC
  Filled 2017-06-05: qty 1

## 2017-06-19 ENCOUNTER — Encounter (HOSPITAL_COMMUNITY)
Admission: RE | Admit: 2017-06-19 | Discharge: 2017-06-19 | Disposition: A | Discharge status: Home / Self Care | Payer: Medicare Other | Source: Ambulatory Visit | Attending: Nephrology | Admitting: Nephrology

## 2017-06-19 VITALS — BP 144/78 | HR 60

## 2017-06-19 DIAGNOSIS — D631 Anemia in chronic kidney disease: Secondary | ICD-10-CM | POA: Diagnosis not present

## 2017-06-19 DIAGNOSIS — D638 Anemia in other chronic diseases classified elsewhere: Secondary | ICD-10-CM

## 2017-06-19 DIAGNOSIS — N183 Chronic kidney disease, stage 3 (moderate): Secondary | ICD-10-CM | POA: Diagnosis not present

## 2017-06-19 LAB — POCT HEMOGLOBIN-HEMACUE: Hemoglobin: 8.9 g/dL — ABNORMAL LOW (ref 12.0–15.0)

## 2017-06-19 MED ORDER — EPOETIN ALFA 10000 UNIT/ML IJ SOLN
10000.0000 [IU] | INTRAMUSCULAR | Status: DC
  Administered 2017-06-19: 10000 [IU] via SUBCUTANEOUS

## 2017-06-19 MED ORDER — EPOETIN ALFA 10000 UNIT/ML IJ SOLN
INTRAMUSCULAR | Status: AC
Start: 2017-06-19 — End: 2017-06-19
  Administered 2017-06-19: 10000 [IU] via SUBCUTANEOUS
  Filled 2017-06-19: qty 1

## 2017-07-03 ENCOUNTER — Encounter (HOSPITAL_COMMUNITY): Payer: Medicare Other

## 2017-07-10 ENCOUNTER — Encounter (HOSPITAL_COMMUNITY)
Admission: RE | Admit: 2017-07-10 | Discharge: 2017-07-10 | Disposition: A | Discharge status: Home / Self Care | Payer: Medicare Other | Source: Ambulatory Visit | Attending: Nephrology | Admitting: Nephrology

## 2017-07-10 VITALS — BP 161/76 | HR 57 | Temp 97.8°F | Resp 20

## 2017-07-10 DIAGNOSIS — D631 Anemia in chronic kidney disease: Secondary | ICD-10-CM | POA: Insufficient documentation

## 2017-07-10 DIAGNOSIS — N183 Chronic kidney disease, stage 3 (moderate): Secondary | ICD-10-CM | POA: Diagnosis not present

## 2017-07-10 DIAGNOSIS — D638 Anemia in other chronic diseases classified elsewhere: Secondary | ICD-10-CM

## 2017-07-10 LAB — IRON AND TIBC
Iron: 49 ug/dL (ref 28–170)
Saturation Ratios: 22 % (ref 10.4–31.8)
TIBC: 225 ug/dL — AB (ref 250–450)
UIBC: 176 ug/dL

## 2017-07-10 LAB — FERRITIN: FERRITIN: 225 ng/mL (ref 11–307)

## 2017-07-10 LAB — POCT HEMOGLOBIN-HEMACUE: HEMOGLOBIN: 9.8 g/dL — AB (ref 12.0–15.0)

## 2017-07-10 MED ORDER — EPOETIN ALFA 10000 UNIT/ML IJ SOLN
INTRAMUSCULAR | Status: AC
  Administered 2017-07-10: 14:00:00 10000 [IU]
  Filled 2017-07-10: qty 1

## 2017-07-10 MED ORDER — EPOETIN ALFA 10000 UNIT/ML IJ SOLN: 10000.0000 [IU] | INTRAMUSCULAR | Status: DC

## 2017-07-24 ENCOUNTER — Encounter (HOSPITAL_COMMUNITY): Payer: Self-pay

## 2017-07-24 ENCOUNTER — Inpatient Hospital Stay (HOSPITAL_COMMUNITY)
Admission: RE | Admit: 2017-07-24 | Discharge: 2017-07-24 | Disposition: A | Discharge status: Home / Self Care | Payer: Medicare Other | Source: Ambulatory Visit | Attending: Nephrology | Admitting: Nephrology

## 2017-08-07 ENCOUNTER — Encounter (HOSPITAL_COMMUNITY)
Admission: RE | Admit: 2017-08-07 | Discharge: 2017-08-07 | Disposition: A | Discharge status: Home / Self Care | Payer: Medicare Other | Source: Ambulatory Visit | Attending: Nephrology | Admitting: Nephrology

## 2017-08-07 ENCOUNTER — Encounter (HOSPITAL_COMMUNITY): Payer: Medicare Other

## 2017-08-07 VITALS — BP 137/83 | HR 61 | Temp 98.0°F | Resp 20

## 2017-08-07 DIAGNOSIS — D631 Anemia in chronic kidney disease: Secondary | ICD-10-CM | POA: Insufficient documentation

## 2017-08-07 DIAGNOSIS — N183 Chronic kidney disease, stage 3 (moderate): Secondary | ICD-10-CM | POA: Insufficient documentation

## 2017-08-07 DIAGNOSIS — D638 Anemia in other chronic diseases classified elsewhere: Secondary | ICD-10-CM

## 2017-08-07 LAB — IRON AND TIBC
Iron: 30 ug/dL (ref 28–170)
Saturation Ratios: 13 % (ref 10.4–31.8)
TIBC: 223 ug/dL — AB (ref 250–450)
UIBC: 193 ug/dL

## 2017-08-07 LAB — FERRITIN: FERRITIN: 263 ng/mL (ref 11–307)

## 2017-08-07 LAB — POCT HEMOGLOBIN-HEMACUE: Hemoglobin: 9.4 g/dL — ABNORMAL LOW (ref 12.0–15.0)

## 2017-08-07 MED ORDER — EPOETIN ALFA 10000 UNIT/ML IJ SOLN
10000.0000 [IU] | INTRAMUSCULAR | Status: DC
  Administered 2017-08-07: 10000 [IU] via SUBCUTANEOUS

## 2017-08-07 MED ORDER — EPOETIN ALFA 10000 UNIT/ML IJ SOLN
INTRAMUSCULAR | Status: AC
  Administered 2017-08-07: 10000 [IU] via SUBCUTANEOUS
  Filled 2017-08-07: qty 1

## 2017-08-08 DIAGNOSIS — N189 Chronic kidney disease, unspecified: Secondary | ICD-10-CM | POA: Diagnosis not present

## 2017-08-08 DIAGNOSIS — R7303 Prediabetes: Secondary | ICD-10-CM | POA: Diagnosis not present

## 2017-08-08 DIAGNOSIS — I10 Essential (primary) hypertension: Secondary | ICD-10-CM | POA: Diagnosis not present

## 2017-08-08 DIAGNOSIS — I509 Heart failure, unspecified: Secondary | ICD-10-CM | POA: Diagnosis not present

## 2017-08-08 DIAGNOSIS — R7309 Other abnormal glucose: Secondary | ICD-10-CM | POA: Diagnosis not present

## 2017-08-21 ENCOUNTER — Encounter (HOSPITAL_COMMUNITY)
Admission: RE | Admit: 2017-08-21 | Discharge: 2017-08-21 | Disposition: A | Discharge status: Home / Self Care | Payer: Medicare Other | Source: Ambulatory Visit | Attending: Nephrology | Admitting: Nephrology

## 2017-08-21 VITALS — BP 145/80 | HR 59 | Resp 20

## 2017-08-21 DIAGNOSIS — D638 Anemia in other chronic diseases classified elsewhere: Secondary | ICD-10-CM

## 2017-08-21 DIAGNOSIS — D631 Anemia in chronic kidney disease: Secondary | ICD-10-CM | POA: Diagnosis not present

## 2017-08-21 DIAGNOSIS — N183 Chronic kidney disease, stage 3 (moderate): Secondary | ICD-10-CM | POA: Diagnosis not present

## 2017-08-21 LAB — POCT HEMOGLOBIN-HEMACUE: HEMOGLOBIN: 9.5 g/dL — AB (ref 12.0–15.0)

## 2017-08-21 MED ORDER — EPOETIN ALFA 10000 UNIT/ML IJ SOLN
INTRAMUSCULAR | Status: AC
  Administered 2017-08-21: 10000 [IU] via SUBCUTANEOUS
  Filled 2017-08-21: qty 1

## 2017-08-21 MED ORDER — EPOETIN ALFA 10000 UNIT/ML IJ SOLN
10000.0000 [IU] | INTRAMUSCULAR | Status: DC
  Administered 2017-08-21: 10000 [IU] via SUBCUTANEOUS

## 2017-09-04 ENCOUNTER — Ambulatory Visit (HOSPITAL_COMMUNITY)
Admission: RE | Admit: 2017-09-04 | Discharge: 2017-09-04 | Disposition: A | Discharge status: Home / Self Care | Payer: Medicare Other | Source: Ambulatory Visit | Attending: Nephrology | Admitting: Nephrology

## 2017-09-04 VITALS — BP 134/67 | HR 59 | Temp 98.0°F | Resp 20

## 2017-09-04 DIAGNOSIS — D638 Anemia in other chronic diseases classified elsewhere: Secondary | ICD-10-CM

## 2017-09-04 DIAGNOSIS — D631 Anemia in chronic kidney disease: Secondary | ICD-10-CM | POA: Insufficient documentation

## 2017-09-04 DIAGNOSIS — N183 Chronic kidney disease, stage 3 (moderate): Secondary | ICD-10-CM | POA: Insufficient documentation

## 2017-09-04 LAB — IRON AND TIBC
IRON: 21 ug/dL — AB (ref 28–170)
SATURATION RATIOS: 10 % — AB (ref 10.4–31.8)
TIBC: 204 ug/dL — AB (ref 250–450)
UIBC: 183 ug/dL

## 2017-09-04 LAB — POCT HEMOGLOBIN-HEMACUE: Hemoglobin: 9.5 g/dL — ABNORMAL LOW (ref 12.0–15.0)

## 2017-09-04 LAB — FERRITIN: Ferritin: 247 ng/mL (ref 11–307)

## 2017-09-04 MED ORDER — EPOETIN ALFA 10000 UNIT/ML IJ SOLN
10000.0000 [IU] | INTRAMUSCULAR | Status: DC
  Administered 2017-09-04: 10000 [IU] via SUBCUTANEOUS

## 2017-09-04 MED ORDER — EPOETIN ALFA 10000 UNIT/ML IJ SOLN
INTRAMUSCULAR | Status: AC
  Administered 2017-09-04: 13:00:00 10000 [IU] via SUBCUTANEOUS
  Filled 2017-09-04: qty 1

## 2017-09-17 ENCOUNTER — Inpatient Hospital Stay (HOSPITAL_COMMUNITY): Admission: RE | Admit: 2017-09-17 | Payer: Medicare Other | Source: Ambulatory Visit

## 2017-09-18 ENCOUNTER — Encounter (HOSPITAL_COMMUNITY): Payer: Medicare Other

## 2017-10-01 ENCOUNTER — Encounter (HOSPITAL_COMMUNITY): Payer: Medicare Other

## 2017-10-02 ENCOUNTER — Other Ambulatory Visit (HOSPITAL_COMMUNITY): Payer: Self-pay | Admitting: *Deleted

## 2017-10-02 DIAGNOSIS — D638 Anemia in other chronic diseases classified elsewhere: Secondary | ICD-10-CM

## 2017-10-03 ENCOUNTER — Ambulatory Visit (HOSPITAL_COMMUNITY)
Admission: RE | Admit: 2017-10-03 | Discharge: 2017-10-03 | Disposition: A | Discharge status: Home / Self Care | Payer: Medicare Other | Source: Ambulatory Visit | Attending: Nephrology | Admitting: Nephrology

## 2017-10-03 VITALS — BP 161/82 | HR 59 | Temp 97.8°F | Resp 20

## 2017-10-03 DIAGNOSIS — N183 Chronic kidney disease, stage 3 (moderate): Secondary | ICD-10-CM | POA: Diagnosis not present

## 2017-10-03 DIAGNOSIS — D638 Anemia in other chronic diseases classified elsewhere: Secondary | ICD-10-CM

## 2017-10-03 DIAGNOSIS — D631 Anemia in chronic kidney disease: Secondary | ICD-10-CM | POA: Diagnosis not present

## 2017-10-03 LAB — POCT HEMOGLOBIN-HEMACUE: HEMOGLOBIN: 9.3 g/dL — AB (ref 12.0–15.0)

## 2017-10-03 LAB — IRON AND TIBC
IRON: 24 ug/dL — AB (ref 28–170)
SATURATION RATIOS: 12 % (ref 10.4–31.8)
TIBC: 199 ug/dL — AB (ref 250–450)
UIBC: 175 ug/dL

## 2017-10-03 LAB — FERRITIN: FERRITIN: 323 ng/mL — AB (ref 11–307)

## 2017-10-03 MED ORDER — EPOETIN ALFA 40000 UNIT/ML IJ SOLN: 30000.0000 [IU] | INTRAMUSCULAR | Status: DC

## 2017-10-03 MED ORDER — EPOETIN ALFA 10000 UNIT/ML IJ SOLN
INTRAMUSCULAR | Status: AC
  Administered 2017-10-03: 10000 [IU]
  Filled 2017-10-03: qty 1

## 2017-10-03 MED ORDER — EPOETIN ALFA 20000 UNIT/ML IJ SOLN
INTRAMUSCULAR | Status: AC
  Administered 2017-10-03: 20000 [IU]
  Filled 2017-10-03: qty 1

## 2017-10-03 NOTE — Progress Notes (Signed)
[place] label was placed on orders of another pt [employer]. Pt was given 30,000 units of procrit [place]. Procrit 10,000 units [place] was ordered.    1300: left voicemail for Stacy at Dr Justin Mend office.  1500 Called md office again and spoke with Stacy, no new orders  1515 Pt was notified via phone

## 2017-10-10 ENCOUNTER — Encounter (HOSPITAL_COMMUNITY): Payer: Medicare Other

## 2017-10-17 ENCOUNTER — Encounter (HOSPITAL_COMMUNITY): Payer: Medicare Other

## 2017-10-17 ENCOUNTER — Encounter (HOSPITAL_COMMUNITY)
Admission: RE | Admit: 2017-10-17 | Discharge: 2017-10-17 | Disposition: A | Discharge status: Home / Self Care | Payer: Medicare Other | Source: Ambulatory Visit | Attending: Nephrology | Admitting: Nephrology

## 2017-10-17 VITALS — BP 163/83 | HR 66 | Temp 97.9°F | Resp 20

## 2017-10-17 DIAGNOSIS — D631 Anemia in chronic kidney disease: Secondary | ICD-10-CM | POA: Diagnosis not present

## 2017-10-17 DIAGNOSIS — D638 Anemia in other chronic diseases classified elsewhere: Secondary | ICD-10-CM

## 2017-10-17 DIAGNOSIS — N183 Chronic kidney disease, stage 3 (moderate): Secondary | ICD-10-CM | POA: Diagnosis not present

## 2017-10-17 LAB — POCT HEMOGLOBIN-HEMACUE: Hemoglobin: 9.3 g/dL — ABNORMAL LOW (ref 12.0–15.0)

## 2017-10-17 MED ORDER — EPOETIN ALFA 10000 UNIT/ML IJ SOLN
10000.0000 [IU] | INTRAMUSCULAR | Status: DC
  Administered 2017-10-17: 10000 [IU] via SUBCUTANEOUS

## 2017-10-17 MED ORDER — EPOETIN ALFA 10000 UNIT/ML IJ SOLN
INTRAMUSCULAR | Status: AC
  Filled 2017-10-17: qty 1

## 2017-10-31 ENCOUNTER — Encounter (HOSPITAL_COMMUNITY): Payer: Medicare Other

## 2017-11-06 ENCOUNTER — Ambulatory Visit (INDEPENDENT_AMBULATORY_CARE_PROVIDER_SITE_OTHER): Payer: Medicare Other | Admitting: Cardiovascular Disease

## 2017-11-06 ENCOUNTER — Encounter: Payer: Self-pay | Admitting: Cardiovascular Disease

## 2017-11-06 VITALS — BP 148/86 | HR 63 | Ht 67.0 in | Wt 145.0 lb

## 2017-11-06 DIAGNOSIS — I340 Nonrheumatic mitral (valve) insufficiency: Secondary | ICD-10-CM

## 2017-11-06 DIAGNOSIS — I5022 Chronic systolic (congestive) heart failure: Secondary | ICD-10-CM | POA: Diagnosis not present

## 2017-11-06 NOTE — Progress Notes (Signed)
Cardiology Office Note   Date:  11/06/2017   ID:  KEYUNDRA FANT, DOB 1940-07-19, MRN [id number]  PCP:  London Pepper, MD  Cardiologist:   Mertie Moores, MD   Chief Complaint  Patient presents with  . Congestive Heart Failure   Problem List:  1. Essential HTN 2. Chronic systolic CHF  - EF 32% -35% 3. Chronic renal disease - stage III 4. Severe MR by TEE     Seen with granddaughter, Julia [name] is a 77 y.o. female who presents for eval of CHF and severe MR .  Recently moved from [place].   She's been treated with medicines and feels quite a bit better. She's not had any surgical repair.     No acute issues  Potassium was elevated recenly and her Aldactone , Ramipril were stopped.  Fairly active for 76 years.  Does her own shopping and housework .   Sleeps with 2 pillows.   Not particularly short of breat with 1 pillow  Had significant CHF in 2015 while in [place] ( no dyspnea, just leg edema )  She originally presented with congestive heart failure. Evaluation revealed that she had normal coronary arteries. She was found have severe mitral regurgitation due to a flail leaflet of the mitral valve. She was also found to have anemia in chronic kidney disease.   Jan. 25, 2018:  [name] is seen today for follow up visit Feeling well,  Echo showed moderate MR. LV function   Oct. 5, 2018:  [name] is seen for follow up of her CHF and moderate MR .  Seen with Mateo Flow, Able to do all her normal activities  Walks regularly - about 45 minutes , no severe dyspnea  No syncope   November 06, 2017:   [name] is seen today for follow-up visit.  She has a history of chronic diastolic congestive heart failure.  She has normal left ventricular systolic function.  There is mild prolapse of the anterior leaflet of the mitral valve.  BP is a bit elevated today .   Has been normal at home and at other doctor visits  Watches her salt .  No dyspnea,   Is not on lasix    Past  Medical History:  Diagnosis Date  . CHF (congestive heart failure) ([place])   . Hypertension     History reviewed. No pertinent surgical history.   Current Outpatient Medications  Medication Sig Dispense Refill  . amLODipine (NORVASC) 2.5 MG tablet Take 2.5 mg by mouth daily.    . carvedilol (COREG) 25 MG tablet Take 25 mg by mouth 2 (two) times daily.  3   No current facility-administered medications for this visit.     Allergies:   Penicillins    Social History:  The patient  reports that she has quit smoking. She has never used smokeless tobacco. She reports that she does not drink alcohol or use drugs.   Family History:  The patient's family history includes Alcohol abuse in her brother; Breast cancer in her sister; Cancer in her mother; Congestive Heart Failure in her sister; Diabetes in her sister; Diverticulitis in her brother; Gout in her sister; Healthy in her brother, brother, and daughter; Hypertension in her father and sister; Kidney disease in her sister; Lung disease in her sister; Stroke in her father.    ROS: Noted in current history, otherwise review systems is negative.  Physical Exam: Blood pressure (!) 148/86, pulse 63, height 5\' 7"  (1.702  m), weight 145 lb (65.8 kg), SpO2 98 %.  GEN:  Well nourished, well developed in no acute distress HEENT: Normal NECK: No JVD; No carotid bruits LYMPHATICS: No lymphadenopathy CARDIAC: RR, soft midsystolic click.  Soft late systolic murmur.  Insistent with mitral valve prolapse. RESPIRATORY:  Clear to auscultation without rales, wheezing or rhonchi  ABDOMEN: Soft, non-tender, non-distended MUSCULOSKELETAL:  No edema; No deformity  SKIN: Warm and dry NEUROLOGIC:  Alert and oriented x 3   EKG:    Recent Labs: 10/17/2017: Hemoglobin 9.3    Lipid Panel No results found for: CHOL, TRIG, HDL, CHOLHDL, VLDL, LDLCALC, LDLDIRECT    Wt Readings from Last 3 Encounters:  11/06/17 145 lb (65.8 kg)  02/28/17 142 lb (64.4 kg)    06/20/16 140 lb 8 oz (63.7 kg)      Other studies Reviewed: Additional studies/ records that were reviewed today include: . Review of the above records demonstrates:    ASSESSMENT AND PLAN:  1.  Chronic systolic congestive heart failure:   He seems to be doing well.  She has not had any signs or symptoms of CHF.  Her blood pressure remains mildly elevated.   She may benefit from being started on a loop diuretic but given her chronic kidney disease, I will leave that decision up to Dr. Justin Mend. Not having any worsening congestive heart failure symptoms.    2. Mitral regurgitation:    Exam is consistent with mitral valve prolapse.  She is asymptomatic.  3. CKD -  Sees Dr. Justin Mend.       Current medicines are reviewed at length with the patient today.  The patient does not have concerns regarding medicines.  Labs/ tests ordered today include:   No orders of the defined types were placed in this encounter.    Mertie Moores, MD  11/06/2017 10:17 AM    [facility] [place], [place], [place]  [address] Phone: [phone]; Fax: [phone]

## 2017-11-06 NOTE — Patient Instructions (Signed)
Medication Instructions:  Your physician recommends that you continue on your current medications as directed. Please refer to the Current Medication list given to you today.   Labwork: None Ordered   Testing/Procedures: None Ordered   Follow-Up: Your physician wants you to follow-up in: 6 months with Dr. Nahser.  You will receive a reminder letter in the mail two months in advance. If you don't receive a letter, please call our office to schedule the follow-up appointment.   If you need a refill on your cardiac medications before your next appointment, please call your pharmacy.   Thank you for choosing [facility]! Armany Mano, RN [phone]    

## 2017-11-10 DIAGNOSIS — N189 Chronic kidney disease, unspecified: Secondary | ICD-10-CM | POA: Diagnosis not present

## 2017-11-10 DIAGNOSIS — I509 Heart failure, unspecified: Secondary | ICD-10-CM | POA: Diagnosis not present

## 2017-11-10 DIAGNOSIS — I10 Essential (primary) hypertension: Secondary | ICD-10-CM | POA: Diagnosis not present

## 2017-11-10 DIAGNOSIS — D649 Anemia, unspecified: Secondary | ICD-10-CM | POA: Diagnosis not present

## 2017-11-10 DIAGNOSIS — R7303 Prediabetes: Secondary | ICD-10-CM | POA: Diagnosis not present

## 2017-11-14 ENCOUNTER — Encounter (HOSPITAL_COMMUNITY): Payer: Medicare Other

## 2017-11-19 ENCOUNTER — Encounter (HOSPITAL_COMMUNITY)
Admission: RE | Admit: 2017-11-19 | Discharge: 2017-11-19 | Disposition: A | Discharge status: Home / Self Care | Payer: Medicare Other | Source: Ambulatory Visit | Attending: Nephrology | Admitting: Nephrology

## 2017-11-19 VITALS — BP 151/82 | HR 54 | Temp 97.7°F | Resp 18

## 2017-11-19 DIAGNOSIS — D638 Anemia in other chronic diseases classified elsewhere: Secondary | ICD-10-CM

## 2017-11-19 DIAGNOSIS — D631 Anemia in chronic kidney disease: Secondary | ICD-10-CM | POA: Insufficient documentation

## 2017-11-19 DIAGNOSIS — N183 Chronic kidney disease, stage 3 (moderate): Secondary | ICD-10-CM | POA: Diagnosis not present

## 2017-11-19 LAB — IRON AND TIBC
Iron: 44 ug/dL (ref 28–170)
Saturation Ratios: 19 % (ref 10.4–31.8)
TIBC: 237 ug/dL — ABNORMAL LOW (ref 250–450)
UIBC: 193 ug/dL

## 2017-11-19 LAB — FERRITIN: Ferritin: 248 ng/mL (ref 11–307)

## 2017-11-19 LAB — POCT HEMOGLOBIN-HEMACUE: Hemoglobin: 9.2 g/dL — ABNORMAL LOW (ref 12.0–15.0)

## 2017-11-19 MED ORDER — EPOETIN ALFA 10000 UNIT/ML IJ SOLN
INTRAMUSCULAR | Status: AC
  Filled 2017-11-19: qty 1

## 2017-11-19 MED ORDER — EPOETIN ALFA 10000 UNIT/ML IJ SOLN
10000.0000 [IU] | INTRAMUSCULAR | Status: DC
  Administered 2017-11-19: 10000 [IU] via SUBCUTANEOUS

## 2017-12-03 ENCOUNTER — Ambulatory Visit (HOSPITAL_COMMUNITY)
Admission: RE | Admit: 2017-12-03 | Discharge: 2017-12-03 | Disposition: A | Discharge status: Home / Self Care | Payer: Medicare Other | Source: Ambulatory Visit | Attending: Nephrology | Admitting: Nephrology

## 2017-12-03 VITALS — BP 141/72 | HR 58 | Temp 97.7°F | Resp 18 | Ht 67.0 in | Wt 146.0 lb

## 2017-12-03 DIAGNOSIS — D631 Anemia in chronic kidney disease: Secondary | ICD-10-CM | POA: Diagnosis not present

## 2017-12-03 DIAGNOSIS — D638 Anemia in other chronic diseases classified elsewhere: Secondary | ICD-10-CM

## 2017-12-03 DIAGNOSIS — N183 Chronic kidney disease, stage 3 (moderate): Secondary | ICD-10-CM | POA: Diagnosis not present

## 2017-12-03 LAB — POCT HEMOGLOBIN-HEMACUE: HEMOGLOBIN: 9.5 g/dL — AB (ref 12.0–15.0)

## 2017-12-03 MED ORDER — EPOETIN ALFA 10000 UNIT/ML IJ SOLN
INTRAMUSCULAR | Status: AC
  Administered 2017-12-03: 10000 [IU] via SUBCUTANEOUS
  Filled 2017-12-03: qty 1

## 2017-12-03 MED ORDER — EPOETIN ALFA 10000 UNIT/ML IJ SOLN
10000.0000 [IU] | INTRAMUSCULAR | Status: DC
  Administered 2017-12-03: 10000 [IU] via SUBCUTANEOUS

## 2017-12-17 ENCOUNTER — Encounter (HOSPITAL_COMMUNITY)
Admission: RE | Admit: 2017-12-17 | Discharge: 2017-12-17 | Disposition: A | Discharge status: Home / Self Care | Payer: Medicare Other | Source: Ambulatory Visit | Attending: Nephrology | Admitting: Nephrology

## 2017-12-17 VITALS — BP 159/82 | HR 57 | Temp 97.7°F | Resp 18

## 2017-12-17 DIAGNOSIS — D631 Anemia in chronic kidney disease: Secondary | ICD-10-CM | POA: Insufficient documentation

## 2017-12-17 DIAGNOSIS — N183 Chronic kidney disease, stage 3 (moderate): Secondary | ICD-10-CM | POA: Insufficient documentation

## 2017-12-17 DIAGNOSIS — D638 Anemia in other chronic diseases classified elsewhere: Secondary | ICD-10-CM

## 2017-12-17 LAB — FERRITIN: Ferritin: 230 ng/mL (ref 11–307)

## 2017-12-17 LAB — POCT HEMOGLOBIN-HEMACUE: Hemoglobin: 10.4 g/dL — ABNORMAL LOW (ref 12.0–15.0)

## 2017-12-17 LAB — IRON AND TIBC
IRON: 38 ug/dL (ref 28–170)
Saturation Ratios: 15 % (ref 10.4–31.8)
TIBC: 260 ug/dL (ref 250–450)
UIBC: 222 ug/dL

## 2017-12-17 MED ORDER — EPOETIN ALFA 10000 UNIT/ML IJ SOLN
10000.0000 [IU] | INTRAMUSCULAR | Status: DC
  Administered 2017-12-17: 10000 [IU] via SUBCUTANEOUS

## 2017-12-17 MED ORDER — EPOETIN ALFA 10000 UNIT/ML IJ SOLN
INTRAMUSCULAR | Status: AC
  Filled 2017-12-17: qty 1

## 2017-12-31 ENCOUNTER — Encounter (HOSPITAL_COMMUNITY): Payer: Medicare Other

## 2018-01-09 ENCOUNTER — Ambulatory Visit (HOSPITAL_COMMUNITY)
Admission: RE | Admit: 2018-01-09 | Discharge: 2018-01-09 | Disposition: A | Discharge status: Home / Self Care | Payer: Medicare Other | Source: Ambulatory Visit | Attending: Nephrology | Admitting: Nephrology

## 2018-01-09 VITALS — BP 160/84 | HR 60 | Temp 97.6°F | Ht 67.0 in | Wt 146.0 lb

## 2018-01-09 DIAGNOSIS — N183 Chronic kidney disease, stage 3 (moderate): Secondary | ICD-10-CM | POA: Diagnosis not present

## 2018-01-09 DIAGNOSIS — D631 Anemia in chronic kidney disease: Secondary | ICD-10-CM | POA: Diagnosis not present

## 2018-01-09 DIAGNOSIS — D638 Anemia in other chronic diseases classified elsewhere: Secondary | ICD-10-CM

## 2018-01-09 LAB — POCT HEMOGLOBIN-HEMACUE: Hemoglobin: 10.4 g/dL — ABNORMAL LOW (ref 12.0–15.0)

## 2018-01-09 MED ORDER — EPOETIN ALFA 10000 UNIT/ML IJ SOLN
INTRAMUSCULAR | Status: AC
  Filled 2018-01-09: qty 1

## 2018-01-09 MED ORDER — EPOETIN ALFA 10000 UNIT/ML IJ SOLN
10000.0000 [IU] | INTRAMUSCULAR | Status: DC
  Administered 2018-01-09: 10000 [IU] via SUBCUTANEOUS

## 2018-01-14 ENCOUNTER — Encounter (HOSPITAL_COMMUNITY): Payer: Medicare Other

## 2018-01-15 DIAGNOSIS — I341 Nonrheumatic mitral (valve) prolapse: Secondary | ICD-10-CM | POA: Diagnosis not present

## 2018-01-15 DIAGNOSIS — D631 Anemia in chronic kidney disease: Secondary | ICD-10-CM | POA: Diagnosis not present

## 2018-01-15 DIAGNOSIS — I129 Hypertensive chronic kidney disease with stage 1 through stage 4 chronic kidney disease, or unspecified chronic kidney disease: Secondary | ICD-10-CM | POA: Diagnosis not present

## 2018-01-15 DIAGNOSIS — N183 Chronic kidney disease, stage 3 (moderate): Secondary | ICD-10-CM | POA: Diagnosis not present

## 2018-01-15 DIAGNOSIS — N179 Acute kidney failure, unspecified: Secondary | ICD-10-CM | POA: Diagnosis not present

## 2018-01-15 DIAGNOSIS — N2581 Secondary hyperparathyroidism of renal origin: Secondary | ICD-10-CM | POA: Diagnosis not present

## 2018-01-15 DIAGNOSIS — I5030 Unspecified diastolic (congestive) heart failure: Secondary | ICD-10-CM | POA: Diagnosis not present

## 2018-01-23 ENCOUNTER — Encounter (HOSPITAL_COMMUNITY): Payer: Medicare Other

## 2018-02-06 ENCOUNTER — Encounter (HOSPITAL_COMMUNITY): Payer: Medicare Other

## 2018-02-11 DIAGNOSIS — I509 Heart failure, unspecified: Secondary | ICD-10-CM | POA: Diagnosis not present

## 2018-02-11 DIAGNOSIS — I10 Essential (primary) hypertension: Secondary | ICD-10-CM | POA: Diagnosis not present

## 2018-02-11 DIAGNOSIS — R7303 Prediabetes: Secondary | ICD-10-CM | POA: Diagnosis not present

## 2018-02-11 DIAGNOSIS — D649 Anemia, unspecified: Secondary | ICD-10-CM | POA: Diagnosis not present

## 2018-02-11 DIAGNOSIS — N183 Chronic kidney disease, stage 3 (moderate): Secondary | ICD-10-CM | POA: Diagnosis not present

## 2018-02-11 DIAGNOSIS — N189 Chronic kidney disease, unspecified: Secondary | ICD-10-CM | POA: Diagnosis not present

## 2018-03-06 ENCOUNTER — Encounter (HOSPITAL_COMMUNITY)
Admission: RE | Admit: 2018-03-06 | Discharge: 2018-03-06 | Disposition: A | Discharge status: Home / Self Care | Payer: Medicare Other | Source: Ambulatory Visit | Attending: Nephrology | Admitting: Nephrology

## 2018-03-06 VITALS — BP 149/74 | HR 59 | Resp 18

## 2018-03-06 DIAGNOSIS — I5022 Chronic systolic (congestive) heart failure: Secondary | ICD-10-CM | POA: Insufficient documentation

## 2018-03-06 DIAGNOSIS — D638 Anemia in other chronic diseases classified elsewhere: Secondary | ICD-10-CM | POA: Diagnosis not present

## 2018-03-06 LAB — POCT HEMOGLOBIN-HEMACUE: Hemoglobin: 9.4 g/dL — ABNORMAL LOW (ref 12.0–15.0)

## 2018-03-06 LAB — IRON AND TIBC
IRON: 32 ug/dL (ref 28–170)
SATURATION RATIOS: 15 % (ref 10.4–31.8)
TIBC: 218 ug/dL — AB (ref 250–450)
UIBC: 186 ug/dL

## 2018-03-06 LAB — FERRITIN: Ferritin: 232 ng/mL (ref 11–307)

## 2018-03-06 MED ORDER — EPOETIN ALFA 2000 UNIT/ML IJ SOLN
INTRAMUSCULAR | Status: AC
  Filled 2018-03-06: qty 1

## 2018-03-06 MED ORDER — EPOETIN ALFA 3000 UNIT/ML IJ SOLN
INTRAMUSCULAR | Status: AC
  Filled 2018-03-06: qty 1

## 2018-03-06 MED ORDER — EPOETIN ALFA 10000 UNIT/ML IJ SOLN
INTRAMUSCULAR | Status: AC
  Administered 2018-03-06: 10000 [IU] via SUBCUTANEOUS
  Filled 2018-03-06: qty 1

## 2018-03-06 MED ORDER — EPOETIN ALFA 10000 UNIT/ML IJ SOLN
10000.0000 [IU] | INTRAMUSCULAR | Status: DC
  Administered 2018-03-06: 10000 [IU] via SUBCUTANEOUS

## 2018-03-20 ENCOUNTER — Inpatient Hospital Stay (HOSPITAL_COMMUNITY)
Admission: RE | Admit: 2018-03-20 | Discharge: 2018-03-20 | Disposition: A | Discharge status: Home / Self Care | Payer: Medicare Other | Source: Ambulatory Visit | Attending: Nephrology | Admitting: Nephrology

## 2018-04-03 ENCOUNTER — Encounter (HOSPITAL_COMMUNITY): Payer: Medicare Other

## 2018-04-27 ENCOUNTER — Ambulatory Visit (HOSPITAL_COMMUNITY)
Admission: RE | Admit: 2018-04-27 | Discharge: 2018-04-27 | Disposition: A | Discharge status: Home / Self Care | Payer: Medicare Other | Source: Ambulatory Visit | Attending: Nephrology | Admitting: Nephrology

## 2018-04-27 VITALS — BP 145/75 | HR 58 | Temp 97.6°F | Resp 20

## 2018-04-27 DIAGNOSIS — Z79899 Other long term (current) drug therapy: Secondary | ICD-10-CM | POA: Diagnosis not present

## 2018-04-27 DIAGNOSIS — D631 Anemia in chronic kidney disease: Secondary | ICD-10-CM | POA: Insufficient documentation

## 2018-04-27 DIAGNOSIS — Z5181 Encounter for therapeutic drug level monitoring: Secondary | ICD-10-CM | POA: Diagnosis not present

## 2018-04-27 DIAGNOSIS — D638 Anemia in other chronic diseases classified elsewhere: Secondary | ICD-10-CM

## 2018-04-27 DIAGNOSIS — N183 Chronic kidney disease, stage 3 (moderate): Secondary | ICD-10-CM | POA: Diagnosis not present

## 2018-04-27 LAB — IRON AND TIBC
Iron: 36 ug/dL (ref 28–170)
SATURATION RATIOS: 16 % (ref 10.4–31.8)
TIBC: 231 ug/dL — AB (ref 250–450)
UIBC: 195 ug/dL

## 2018-04-27 LAB — POCT HEMOGLOBIN-HEMACUE: HEMOGLOBIN: 9.8 g/dL — AB (ref 12.0–15.0)

## 2018-04-27 LAB — FERRITIN: FERRITIN: 343 ng/mL — AB (ref 11–307)

## 2018-04-27 MED ORDER — EPOETIN ALFA 10000 UNIT/ML IJ SOLN
10000.0000 [IU] | INTRAMUSCULAR | Status: DC
  Administered 2018-04-27: 10000 [IU] via SUBCUTANEOUS

## 2018-04-27 MED ORDER — EPOETIN ALFA 10000 UNIT/ML IJ SOLN
INTRAMUSCULAR | Status: AC
  Administered 2018-04-27: 10000 [IU] via SUBCUTANEOUS
  Filled 2018-04-27: qty 1

## 2018-05-11 ENCOUNTER — Encounter (HOSPITAL_COMMUNITY): Payer: Medicare Other

## 2018-05-22 ENCOUNTER — Encounter (HOSPITAL_COMMUNITY): Payer: Medicare Other

## 2018-05-25 ENCOUNTER — Encounter (HOSPITAL_COMMUNITY): Payer: Medicare Other

## 2018-06-05 DIAGNOSIS — N2581 Secondary hyperparathyroidism of renal origin: Secondary | ICD-10-CM | POA: Diagnosis not present

## 2018-06-05 DIAGNOSIS — N183 Chronic kidney disease, stage 3 (moderate): Secondary | ICD-10-CM | POA: Diagnosis not present

## 2018-06-05 DIAGNOSIS — Z0000 Encounter for general adult medical examination without abnormal findings: Secondary | ICD-10-CM | POA: Diagnosis not present

## 2018-06-05 DIAGNOSIS — I509 Heart failure, unspecified: Secondary | ICD-10-CM | POA: Diagnosis not present

## 2018-06-05 DIAGNOSIS — R7309 Other abnormal glucose: Secondary | ICD-10-CM | POA: Diagnosis not present

## 2018-06-05 DIAGNOSIS — I10 Essential (primary) hypertension: Secondary | ICD-10-CM | POA: Diagnosis not present

## 2018-10-27 ENCOUNTER — Telehealth: Payer: Self-pay

## 2018-10-27 NOTE — Telephone Encounter (Signed)
Left message for pt to call back about upcoming appt and her preference for her visit (phone or video).

## 2018-10-30 NOTE — Telephone Encounter (Signed)
Left message for pt to call back about preference for upcoming visit (video or phone).

## 2018-11-03 NOTE — Telephone Encounter (Signed)
Attempted to call patient regarding appointment, no answer Called patient's granddaughter and left message asking her to call back regarding the appointment with Dr. Acie Fredrickson on 6/12

## 2018-11-04 NOTE — Telephone Encounter (Signed)
I called the patient, multiple times and it goes to voicemail.

## 2018-11-04 NOTE — Telephone Encounter (Signed)
Patient returned call she accepted call virtual visit to telephone.  She said when you call her phone goes to VM to just call her again then it will ring.

## 2018-11-06 ENCOUNTER — Telehealth: Payer: Medicare Other | Admitting: Cardiovascular Disease

## 2019-01-06 DIAGNOSIS — R7303 Prediabetes: Secondary | ICD-10-CM | POA: Diagnosis not present

## 2019-01-06 DIAGNOSIS — I509 Heart failure, unspecified: Secondary | ICD-10-CM | POA: Diagnosis not present

## 2019-01-06 DIAGNOSIS — N183 Chronic kidney disease, stage 3 (moderate): Secondary | ICD-10-CM | POA: Diagnosis not present

## 2019-01-06 DIAGNOSIS — I10 Essential (primary) hypertension: Secondary | ICD-10-CM | POA: Diagnosis not present

## 2019-01-06 DIAGNOSIS — R0981 Nasal congestion: Secondary | ICD-10-CM | POA: Diagnosis not present

## 2019-01-28 ENCOUNTER — Ambulatory Visit: Payer: Medicare Other | Admitting: Cardiovascular Disease

## 2019-02-25 DIAGNOSIS — H2513 Age-related nuclear cataract, bilateral: Secondary | ICD-10-CM | POA: Diagnosis not present

## 2019-03-02 DIAGNOSIS — M79672 Pain in left foot: Secondary | ICD-10-CM | POA: Diagnosis not present

## 2019-03-08 ENCOUNTER — Ambulatory Visit (INDEPENDENT_AMBULATORY_CARE_PROVIDER_SITE_OTHER): Payer: Medicare Other | Admitting: Cardiovascular Disease

## 2019-03-08 ENCOUNTER — Other Ambulatory Visit: Payer: Self-pay

## 2019-03-08 ENCOUNTER — Encounter: Payer: Self-pay | Admitting: Cardiovascular Disease

## 2019-03-08 VITALS — BP 144/86 | HR 64 | Ht 66.0 in | Wt 148.0 lb

## 2019-03-08 DIAGNOSIS — I340 Nonrheumatic mitral (valve) insufficiency: Secondary | ICD-10-CM | POA: Diagnosis not present

## 2019-03-08 DIAGNOSIS — I5022 Chronic systolic (congestive) heart failure: Secondary | ICD-10-CM | POA: Diagnosis not present

## 2019-03-08 NOTE — Patient Instructions (Signed)

## 2019-03-08 NOTE — Progress Notes (Signed)
Cardiology Office Note   Date:  03/08/2019   ID:  Julia [name], Julia [name] [birth year], MRN [id number]  PCP:  London Pepper, MD  Cardiologist:   Mertie Moores, MD   No chief complaint on file.  Problem List:  1. Essential HTN 2. Chronic systolic CHF  - EF [id number] 3. Chronic renal disease - stage III 4. Severe MR by TEE     Seen with granddaughter, Julia [name] is a 78 y.o. female who presents for eval of CHF and severe MR .  Recently moved from [place].   She's been treated with medicines and feels quite a bit better. She's not had any surgical repair.     No acute issues  Potassium was elevated recenly and her Aldactone , Ramipril were stopped.  Fairly active for 74 years.  Does her own shopping and housework .   Sleeps with 2 pillows.   Not particularly short of breat with 1 pillow  Had significant CHF in 2015 while in [place] ( no dyspnea, just leg edema )  She originally presented with congestive heart failure. Evaluation revealed that she had normal coronary arteries. She was found have severe mitral regurgitation due to a flail leaflet of the mitral valve. She was also found to have anemia in chronic kidney disease.   Jan. 25, 2018:  Julia [name] is seen today for follow up visit Feeling well,  Echo showed moderate MR. LV function   Oct. 5, 2018:  Julia [name] is seen for follow up of her CHF and moderate MR .  Seen with Julia [name], Able to do all her normal activities  Walks regularly - about 45 minutes , no severe dyspnea  No syncope   November 06, 2017:   Julia [name] is seen today for follow-up visit.  She has a history of chronic diastolic congestive heart failure.  She has normal left ventricular systolic function.  There is mild prolapse of the anterior leaflet of the mitral valve.  BP is a bit elevated today .   Has been normal at home and at other doctor visits  Watches her salt .  No dyspnea,   Is not on lasix    March 08, 2019: Julia [name] is seen today for  follow-up of her hypertension and chronic diastolic congestive heart failure.  She has mild mitral valve prolapse of the anterior leaflet.  BP is well controled typically .  No CP or dyspnea .   Past Medical History:  Diagnosis Date  . CHF (congestive heart failure) ([place])   . Hypertension     History reviewed. No pertinent surgical history.   Current Outpatient Medications  Medication Sig Dispense Refill  . amLODipine (NORVASC) 5 MG tablet Take 5 mg by mouth daily.    . carvedilol (COREG) 25 MG tablet Take 25 mg by mouth 2 (two) times daily.  3   No current facility-administered medications for this visit.     Allergies:   Penicillins    Social History:  The patient  reports that she has quit smoking. She has never used smokeless tobacco. She reports that she does not drink alcohol or use drugs.   Family History:  The patient's family history includes Alcohol abuse in her brother; Breast cancer in her sister; Cancer in her mother; Congestive Heart Failure in her sister; Diabetes in her sister; Diverticulitis in her brother; Gout in her sister; Healthy in her brother, brother, and daughter; Hypertension in her father and sister; Kidney disease in  her sister; Lung disease in her sister; Stroke in her father.    ROS: Noted in current history, otherwise review systems is negative.   Physical Exam: Blood pressure (!) 144/86, pulse 64, height 5\' 6"  (1.676 m), weight 148 lb (67.1 kg), SpO2 99 %.  GEN:   Elderly female,  NAD  HEENT: Normal NECK: No JVD; No carotid bruits LYMPHATICS: No lymphadenopathy CARDIAC: RRR ,  Soft systolic murmur radiating to her axilla  RESPIRATORY:  Clear to auscultation without rales, wheezing or rhonchi  ABDOMEN: Soft, non-tender, non-distended MUSCULOSKELETAL:  No edema; No deformity  SKIN: Warm and dry NEUROLOGIC:  Alert and oriented x 3   EKG: March 08, 2019: Normal sinus rhythm at 64.  Right bundle branch block.  Recent Labs: 04/27/2018:  Hemoglobin 9.8    Lipid Panel No results found for: CHOL, TRIG, HDL, CHOLHDL, VLDL, LDLCALC, LDLDIRECT    Wt Readings from Last 3 Encounters:  03/08/19 148 lb (67.1 kg)  01/09/18 146 lb (66.2 kg)  12/03/17 146 lb (66.2 kg)      Other studies Reviewed: Additional studies/ records that were reviewed today include: . Review of the above records demonstrates:    ASSESSMENT AND PLAN:  1.  Chronic systolic congestive heart failure:  Julia [name] seems to be very stable.  Continue current medications.  2. Mitral regurgitation:    She is asymptomatic.  Mitral regurgitation looks moderate.  Continue to follow.  3. CKD -  Sees Dr. Justin Mend.       Current medicines are reviewed at length with the patient today.  The patient does not have concerns regarding medicines.  Labs/ tests ordered today include:   No orders of the defined types were placed in this encounter.    Mertie Moores, MD  03/08/2019 3:28 PM    [facility] [place], [place], [place]  [address] Phone: [phone]; Fax: [phone]

## 2019-06-10 DIAGNOSIS — R319 Hematuria, unspecified: Secondary | ICD-10-CM | POA: Diagnosis not present

## 2019-06-10 DIAGNOSIS — I10 Essential (primary) hypertension: Secondary | ICD-10-CM | POA: Diagnosis not present

## 2019-10-09 DIAGNOSIS — Z23 Encounter for immunization: Secondary | ICD-10-CM | POA: Diagnosis not present

## 2019-11-01 DIAGNOSIS — Z23 Encounter for immunization: Secondary | ICD-10-CM | POA: Diagnosis not present

## 2019-11-10 DIAGNOSIS — N183 Chronic kidney disease, stage 3 unspecified: Secondary | ICD-10-CM | POA: Diagnosis not present

## 2019-11-10 DIAGNOSIS — Z0000 Encounter for general adult medical examination without abnormal findings: Secondary | ICD-10-CM | POA: Diagnosis not present

## 2019-11-10 DIAGNOSIS — R7309 Other abnormal glucose: Secondary | ICD-10-CM | POA: Diagnosis not present

## 2019-11-10 DIAGNOSIS — I509 Heart failure, unspecified: Secondary | ICD-10-CM | POA: Diagnosis not present

## 2019-11-10 DIAGNOSIS — I10 Essential (primary) hypertension: Secondary | ICD-10-CM | POA: Diagnosis not present

## 2020-03-09 NOTE — Progress Notes (Signed)
Cardiology Office Note:    Date:  03/10/2020   ID:  Julia [name], DOB 12-Jul-1940, MRN [id number]  PCP:  London Pepper, MD  [facility] Cardiologist:  Mertie Moores, MD   [facility] Electrophysiologist:  None   Referring MD: London Pepper, MD   Chief Complaint:  Follow-up (CHF, MR)    Patient Profile:    Julia [name] is a 79 y.o. female with:   Heart failure with reduced ejection fraction w/ improved LVF  Non-ischemic cardiomyopathy / Cath in 12/15 w normal coronary arteries   Echocardiogram 11/15: EF 25  TEE 1/16: EF 35-40  Echocardiogram 8/17: EF 55-60  Chronic kidney disease stage IV  Hypertension   Mitral regurgitation   Severe by TEE in 1/16  Mod by Echocardiogram 8/17  Prior CV studies: Echocardiogram 12/28/15 Mild conc LVH, EF 55-60, no RWMA, Gr 1 DD, mildly increased transvalvular AV velocity (peak 217 m/s), mild MVP, mod MR, severe LAE   TEE 06/06/14 [place], [place]) EF 35-40, severe flail motion of middle segment of ant MV leaflet, severe MR  Cardiac catheterization 04/28/14 ([place]) Normal coronary arteries  Echocardiogram 04/04/14 [place], [place]) EF 25, mild AI, mod to severe MR, marked LAE, mod TR  History of Present Illness:    Ms. Julia [name] was last seen by Dr. Acie Fredrickson in 02/2019.  She returns for follow up.  She is here with her granddaughter.  Overall, she has been doing well.  She has not had chest discomfort, significant shortness of breath, orthopnea, syncope, leg swelling.  She does note that she can hear her heartbeat in her ear.      Past Medical History:  Diagnosis Date  . CKD (chronic kidney disease)   . HFrEF (heart failure with reduced ejection fraction) ([place])    Non-ischemic cardiomyopathy / Cath in 12/15 w normal coronary arteries  // Echocardiogram 11/15: EF 25 // TEE 1/16: EF 35-40 // Echocardiogram 8/17: EF 55-60   . Hypertension   . Mitral regurgitation    severe by TEE in 2015 // Echo in 8/17: mod MR    Current  Medications: No outpatient medications have been marked as taking for the 03/10/20 encounter (Office Visit) with Richardson Dopp T, PA-C.     Allergies:   Penicillins   Social History   Tobacco Use  . Smoking status: Former Research scientist (life sciences)  . Smokeless tobacco: Never Used  Vaping Use  . Vaping Use: Never used  Substance Use Topics  . Alcohol use: No  . Drug use: No     Family Hx: The patient's family history includes Alcohol abuse in her brother; Breast cancer in her sister; Cancer in her mother; Congestive Heart Failure in her sister; Diabetes in her sister; Diverticulitis in her brother; Gout in her sister; Healthy in her brother, brother, and daughter; Hypertension in her father and sister; Kidney disease in her sister; Lung disease in her sister; Stroke in her father.  ROS   EKGs/Labs/Other Test Reviewed:    EKG:  EKG is  ordered today.  The ekg ordered today demonstrates normal sinus rhythm, heart 72, normal axis, right bundle branch block, QTC by my calculation is 482, no change from prior tracings  Recent Labs: No results found for requested labs within last 8760 hours.   Recent Lipid Panel No results found for: CHOL, TRIG, HDL, CHOLHDL, LDLCALC, LDLDIRECT    Risk Assessment/Calculations:      Physical Exam:    VS:  BP 128/64   Pulse 72  Ht 5\' 6"  (1.676 m)   Wt 152 lb 12.8 oz (69.3 kg)   SpO2 99%   BMI 24.66 kg/m     Wt Readings from Last 3 Encounters:  03/10/20 152 lb 12.8 oz (69.3 kg)  03/08/19 148 lb (67.1 kg)  01/09/18 146 lb (66.2 kg)     Constitutional:      Appearance: Healthy appearance. Not in distress.  Neck:     Thyroid: No thyromegaly.     Vascular: Carotid bruit (? L bruit vs radiating murmur) present. JVD normal.  Pulmonary:     Effort: Pulmonary effort is normal.     Breath sounds: No wheezing. No rales.  Cardiovascular:     Normal rate. Regular rhythm. Normal S1. Normal S2.     Murmurs: There is a grade 2/6 systolic murmur at the URSB and  ULSB.  Edema:    Peripheral edema absent.  Abdominal:     Palpations: Abdomen is soft.  Skin:    General: Skin is warm and dry.  Neurological:     Mental Status: Alert and oriented to person, place and time.     Cranial Nerves: Cranial nerves are intact.      ASSESSMENT & PLAN:    1. HFrEF (heart failure with reduced ejection fraction) (HCC) Nonischemic cardiomyopathy.  Her EF improved to normal by most recent echocardiogram in 2017.  She does not have any evidence of volume excess on exam.  She is NYHA II.  2. Non-rheumatic mitral regurgitation Most recent echocardiogram in 2017 demonstrated moderate mitral regurgitation.  Since it has been several years since her last study, I will arrange a follow-up echocardiogram.  3. Essential hypertension The patient's blood pressure is controlled on her current regimen.  Continue current therapy.    4. CKD (chronic kidney disease) stage 4, GFR 15-29 ml/min (HCC) She is followed by nephrology, Dr. Justin Mend.  5. Pulsatile tinnitus 6. Bruit of left carotid artery She notes a hx of pulsatile tinnitus.  Radiating murmur vs bruit on the L on exam.  I will go ahead and get a Carotid US to r/o significant carotid artery stenosis.     Dispo:  Return in about 1 year (around 03/10/2021) for Routine Follow Up, w/ Dr. Acie Fredrickson, in person.   Medication Adjustments/Labs and Tests Ordered: Current medicines are reviewed at length with the patient today.  Concerns regarding medicines are outlined above.  Tests Ordered: Orders Placed This Encounter  Procedures  . EKG 12-Lead  . ECHOCARDIOGRAM COMPLETE  . VAS US CAROTID   Medication Changes: No orders of the defined types were placed in this encounter.   Signed, Richardson Dopp, PA-C  03/10/2020 1:46 PM    [facility] [place], [place], [place]  [address] Phone: [phone]; Fax: [phone]

## 2020-03-10 ENCOUNTER — Encounter: Payer: Self-pay | Admitting: Physician Assistant

## 2020-03-10 ENCOUNTER — Ambulatory Visit (INDEPENDENT_AMBULATORY_CARE_PROVIDER_SITE_OTHER): Payer: Medicare Other | Admitting: Physician Assistant

## 2020-03-10 ENCOUNTER — Other Ambulatory Visit: Payer: Self-pay

## 2020-03-10 VITALS — BP 128/64 | HR 72 | Ht 66.0 in | Wt 152.8 lb

## 2020-03-10 DIAGNOSIS — I10 Essential (primary) hypertension: Secondary | ICD-10-CM

## 2020-03-10 DIAGNOSIS — I5020 Unspecified systolic (congestive) heart failure: Secondary | ICD-10-CM

## 2020-03-10 DIAGNOSIS — R0989 Other specified symptoms and signs involving the circulatory and respiratory systems: Secondary | ICD-10-CM | POA: Diagnosis not present

## 2020-03-10 DIAGNOSIS — I340 Nonrheumatic mitral (valve) insufficiency: Secondary | ICD-10-CM | POA: Diagnosis not present

## 2020-03-10 DIAGNOSIS — N184 Chronic kidney disease, stage 4 (severe): Secondary | ICD-10-CM | POA: Diagnosis not present

## 2020-03-10 DIAGNOSIS — H93A9 Pulsatile tinnitus, unspecified ear: Secondary | ICD-10-CM | POA: Diagnosis not present

## 2020-03-10 NOTE — Patient Instructions (Signed)
Medication Instructions:  *If you need a refill on your cardiac medications before your next appointment, please call your pharmacy*  Lab Work: If you have labs (blood work) drawn today and your tests are completely normal, you will receive your results only by: Marland Kitchen MyChart Message (if you have MyChart) OR . A paper copy in the mail If you have any lab test that is abnormal or we need to change your treatment, we will call you to review the results.  Testing/Procedures: Your physician has requested that you have an echocardiogram. Echocardiography is a painless test that uses sound waves to create images of your heart. It provides your doctor with information about the size and shape of your heart and how well your heart's chambers and valves are working. This procedure takes approximately one hour. There are no restrictions for this procedure.  Your physician has requested that you have a carotid duplex. This test is an ultrasound of the carotid arteries in your neck. It looks at blood flow through these arteries that supply the brain with blood. Allow one hour for this exam. There are no restrictions or special instructions.  Follow-Up: At [facility], you and your health needs are our priority.  As part of our continuing mission to provide you with exceptional heart care, we have created designated Provider Care Teams.  These Care Teams include your primary Cardiologist (physician) and Advanced Practice Providers (APPs -  Physician Assistants and Nurse Practitioners) who all work together to provide you with the care you need, when you need it.  We recommend signing up for the patient portal called "MyChart".  Sign up information is provided on this After Visit Summary.  MyChart is used to connect with patients for Virtual Visits (Telemedicine).  Patients are able to view lab/test results, encounter notes, upcoming appointments, etc.  Non-urgent messages can be sent to your provider as well.    To learn more about what you can do with MyChart, go to [email or web address].    Your next appointment:   1 year(s)  The format for your next appointment:   In Person  Provider:   You may see Mertie Moores, MD or one of the following Advanced Practice Providers on your designated Care Team:    Richardson Dopp, PA-C  [place], [place]

## 2020-04-04 ENCOUNTER — Encounter (HOSPITAL_COMMUNITY): Payer: Self-pay

## 2020-04-04 ENCOUNTER — Other Ambulatory Visit: Payer: Self-pay

## 2020-04-04 ENCOUNTER — Other Ambulatory Visit (HOSPITAL_COMMUNITY): Payer: Medicare Other

## 2020-04-04 ENCOUNTER — Encounter (HOSPITAL_COMMUNITY): Payer: Self-pay | Admitting: Physician Assistant

## 2020-04-04 ENCOUNTER — Ambulatory Visit (HOSPITAL_COMMUNITY)
Admission: RE | Admit: 2020-04-04 | Discharge: 2020-04-04 | Disposition: A | Discharge status: Home / Self Care | Payer: Medicare Other | Source: Ambulatory Visit | Attending: Cardiology | Admitting: Cardiology

## 2020-04-04 DIAGNOSIS — I5020 Unspecified systolic (congestive) heart failure: Secondary | ICD-10-CM

## 2020-04-04 DIAGNOSIS — I10 Essential (primary) hypertension: Secondary | ICD-10-CM | POA: Diagnosis not present

## 2020-04-04 DIAGNOSIS — N184 Chronic kidney disease, stage 4 (severe): Secondary | ICD-10-CM

## 2020-04-04 DIAGNOSIS — I340 Nonrheumatic mitral (valve) insufficiency: Secondary | ICD-10-CM | POA: Diagnosis not present

## 2020-04-04 DIAGNOSIS — H93A9 Pulsatile tinnitus, unspecified ear: Secondary | ICD-10-CM | POA: Insufficient documentation

## 2020-04-04 DIAGNOSIS — R0989 Other specified symptoms and signs involving the circulatory and respiratory systems: Secondary | ICD-10-CM | POA: Diagnosis not present

## 2020-04-04 NOTE — Progress Notes (Signed)
Verified appointment "no show" status with AEulas Post at 12:59.

## 2020-04-05 ENCOUNTER — Telehealth: Payer: Self-pay

## 2020-04-05 DIAGNOSIS — E785 Hyperlipidemia, unspecified: Secondary | ICD-10-CM

## 2020-04-05 DIAGNOSIS — R0989 Other specified symptoms and signs involving the circulatory and respiratory systems: Secondary | ICD-10-CM

## 2020-04-05 NOTE — Telephone Encounter (Signed)
-----   Message from Liliane Shi, [place] sent at 04/05/2020  1:43 PM EST ----- Please call the patient.   Her carotid US shows mild bilateral carotid plaque.   No further carotid ultrasounds needed.  But, she might benefit from lowering her cholesterol. PLAN:  - Please arrange fasting Lipids sometime in the next 1 month.  Richardson Dopp, PA-C    04/05/2020 1:36 PM   Carotid US 11/21: Bilat ICA 1-39

## 2020-05-05 ENCOUNTER — Other Ambulatory Visit: Payer: Medicare Other | Admitting: *Deleted

## 2020-05-05 ENCOUNTER — Other Ambulatory Visit: Payer: Self-pay

## 2020-05-05 ENCOUNTER — Ambulatory Visit (HOSPITAL_COMMUNITY): Payer: Medicare Other | Attending: Physician Assistant

## 2020-05-05 DIAGNOSIS — E785 Hyperlipidemia, unspecified: Secondary | ICD-10-CM | POA: Diagnosis not present

## 2020-05-05 DIAGNOSIS — I5020 Unspecified systolic (congestive) heart failure: Secondary | ICD-10-CM | POA: Insufficient documentation

## 2020-05-05 DIAGNOSIS — N184 Chronic kidney disease, stage 4 (severe): Secondary | ICD-10-CM | POA: Diagnosis not present

## 2020-05-05 DIAGNOSIS — R0989 Other specified symptoms and signs involving the circulatory and respiratory systems: Secondary | ICD-10-CM | POA: Diagnosis not present

## 2020-05-05 DIAGNOSIS — I340 Nonrheumatic mitral (valve) insufficiency: Secondary | ICD-10-CM | POA: Diagnosis not present

## 2020-05-05 DIAGNOSIS — H93A9 Pulsatile tinnitus, unspecified ear: Secondary | ICD-10-CM | POA: Diagnosis not present

## 2020-05-05 DIAGNOSIS — I10 Essential (primary) hypertension: Secondary | ICD-10-CM

## 2020-05-05 LAB — ECHOCARDIOGRAM COMPLETE
AR max vel: 1.16 cm2
AV Area VTI: 1.21 cm2
AV Area mean vel: 1.14 cm2
AV Mean grad: 11 mmHg
AV Peak grad: 18.2 mmHg
Ao pk vel: 2.13 m/s
Area-P 1/2: 1.75 cm2
MV M vel: 6.07 m/s
MV Peak grad: 147.4 mmHg
P 1/2 time: 461 msec
S' Lateral: 3.3 cm

## 2020-05-05 LAB — LIPID PANEL
Chol/HDL Ratio: 3.2 ratio (ref 0.0–4.4)
Cholesterol, Total: 172 mg/dL (ref 100–199)
HDL: 53 mg/dL (ref >39)
LDL Chol Calc (NIH): 106 mg/dL — ABNORMAL HIGH (ref 0–99)
Triglycerides: 66 mg/dL (ref 0–149)
VLDL Cholesterol Cal: 13 mg/dL (ref 5–40)

## 2020-05-09 ENCOUNTER — Other Ambulatory Visit: Payer: Self-pay

## 2020-05-09 ENCOUNTER — Encounter: Payer: Self-pay | Admitting: Physician Assistant

## 2020-05-09 DIAGNOSIS — E785 Hyperlipidemia, unspecified: Secondary | ICD-10-CM

## 2020-05-09 DIAGNOSIS — I10 Essential (primary) hypertension: Secondary | ICD-10-CM

## 2020-05-09 DIAGNOSIS — I5020 Unspecified systolic (congestive) heart failure: Secondary | ICD-10-CM

## 2020-05-09 MED ORDER — ROSUVASTATIN CALCIUM 5 MG PO TABS: 5.0000 mg | ORAL_TABLET | Freq: Every day | ORAL | 3 refills | Status: DC

## 2020-07-06 DIAGNOSIS — I509 Heart failure, unspecified: Secondary | ICD-10-CM | POA: Diagnosis not present

## 2020-07-06 DIAGNOSIS — I10 Essential (primary) hypertension: Secondary | ICD-10-CM | POA: Diagnosis not present

## 2020-07-06 DIAGNOSIS — I340 Nonrheumatic mitral (valve) insufficiency: Secondary | ICD-10-CM | POA: Diagnosis not present

## 2020-07-06 DIAGNOSIS — R7303 Prediabetes: Secondary | ICD-10-CM | POA: Diagnosis not present

## 2020-07-06 DIAGNOSIS — N2581 Secondary hyperparathyroidism of renal origin: Secondary | ICD-10-CM | POA: Diagnosis not present

## 2020-07-06 DIAGNOSIS — D649 Anemia, unspecified: Secondary | ICD-10-CM | POA: Diagnosis not present

## 2020-07-06 DIAGNOSIS — N183 Chronic kidney disease, stage 3 unspecified: Secondary | ICD-10-CM | POA: Diagnosis not present

## 2020-08-11 ENCOUNTER — Other Ambulatory Visit: Payer: Medicare Other

## 2020-09-26 DIAGNOSIS — N179 Acute kidney failure, unspecified: Secondary | ICD-10-CM | POA: Diagnosis not present

## 2020-09-26 DIAGNOSIS — N2581 Secondary hyperparathyroidism of renal origin: Secondary | ICD-10-CM | POA: Diagnosis not present

## 2020-09-26 DIAGNOSIS — I5030 Unspecified diastolic (congestive) heart failure: Secondary | ICD-10-CM | POA: Diagnosis not present

## 2020-09-26 DIAGNOSIS — D631 Anemia in chronic kidney disease: Secondary | ICD-10-CM | POA: Diagnosis not present

## 2020-09-26 DIAGNOSIS — I129 Hypertensive chronic kidney disease with stage 1 through stage 4 chronic kidney disease, or unspecified chronic kidney disease: Secondary | ICD-10-CM | POA: Diagnosis not present

## 2020-09-26 DIAGNOSIS — N189 Chronic kidney disease, unspecified: Secondary | ICD-10-CM | POA: Diagnosis not present

## 2020-09-26 DIAGNOSIS — N183 Chronic kidney disease, stage 3 unspecified: Secondary | ICD-10-CM | POA: Diagnosis not present

## 2020-09-26 DIAGNOSIS — I341 Nonrheumatic mitral (valve) prolapse: Secondary | ICD-10-CM | POA: Diagnosis not present

## 2020-10-10 ENCOUNTER — Encounter (HOSPITAL_COMMUNITY): Payer: Medicare Other

## 2020-10-19 ENCOUNTER — Other Ambulatory Visit (HOSPITAL_COMMUNITY): Payer: Self-pay

## 2020-10-20 ENCOUNTER — Encounter (HOSPITAL_COMMUNITY): Payer: Medicare Other

## 2020-10-27 ENCOUNTER — Encounter (HOSPITAL_COMMUNITY): Payer: Medicare Other

## 2020-11-08 ENCOUNTER — Other Ambulatory Visit: Payer: Self-pay

## 2020-11-08 ENCOUNTER — Encounter (HOSPITAL_COMMUNITY)
Admission: RE | Admit: 2020-11-08 | Discharge: 2020-11-08 | Disposition: A | Discharge status: Home / Self Care | Payer: Medicare Other | Source: Ambulatory Visit | Attending: Nephrology | Admitting: Nephrology

## 2020-11-08 DIAGNOSIS — D631 Anemia in chronic kidney disease: Secondary | ICD-10-CM | POA: Diagnosis not present

## 2020-11-08 DIAGNOSIS — N184 Chronic kidney disease, stage 4 (severe): Secondary | ICD-10-CM | POA: Diagnosis not present

## 2020-11-08 MED ORDER — SODIUM CHLORIDE 0.9 % IV SOLN
510.0000 mg | INTRAVENOUS | Status: DC
  Administered 2020-11-08: 510 mg via INTRAVENOUS
  Filled 2020-11-08: qty 510

## 2020-11-08 NOTE — Discharge Instructions (Signed)
Ferumoxytol injection What is this medication? FERUMOXYTOL is an iron complex. Iron is used to make healthy red blood cells, which carry oxygen and nutrients throughout the body. This medicine is used totreat iron deficiency anemia. This medicine may be used for other purposes; ask your health care provider orpharmacist if you have questions. COMMON BRAND NAME(S): Feraheme What should I tell my care team before I take this medication? They need to know if you have any of these conditions: anemia not caused by low iron levels high levels of iron in the blood magnetic resonance imaging (MRI) test scheduled an unusual or allergic reaction to iron, other medicines, foods, dyes, or preservatives pregnant or trying to get pregnant breast-feeding How should I use this medication? This medicine is for injection into a vein. It is given by a health careprofessional in a hospital or clinic setting. Talk to your pediatrician regarding the use of this medicine in children.Special care may be needed. Overdosage: If you think you have taken too much of this medicine contact apoison control center or emergency room at once. NOTE: This medicine is only for you. Do not share this medicine with others. What if I miss a dose? It is important not to miss your dose. Call your doctor or health careprofessional if you are unable to keep an appointment. What may interact with this medication? This medicine may interact with the following medications: other iron products This list may not describe all possible interactions. Give your health care provider a list of all the medicines, herbs, non-prescription drugs, or dietary supplements you use. Also tell them if you smoke, drink alcohol, or use illegaldrugs. Some items may interact with your medicine. What should I watch for while using this medication? Visit your doctor or healthcare professional regularly. Tell your doctor or healthcare professional if your  symptoms do not start to get better or if theyget worse. You may need blood work done while you are taking this medicine. You may need to follow a special diet. Talk to your doctor. Foods that contain iron include: whole grains/cereals, dried fruits, beans, or peas, leafy greenvegetables, and organ meats (liver, kidney). What side effects may I notice from receiving this medication? Side effects that you should report to your doctor or health care professionalas soon as possible: allergic reactions like skin rash, itching or hives, swelling of the face, lips, or tongue breathing problems changes in blood pressure feeling faint or lightheaded, falls fever or chills flushing, sweating, or hot feelings swelling of the ankles or feet Side effects that usually do not require medical attention (report to yourdoctor or health care professional if they continue or are bothersome): diarrhea headache nausea, vomiting stomach pain This list may not describe all possible side effects. Call your doctor for medical advice about side effects. You may report side effects to FDA at1-800-FDA-1088. Where should I keep my medication? This drug is given in a hospital or clinic and will not be stored at home. NOTE: This sheet is a summary. It may not cover all possible information. If you have questions about this medicine, talk to your doctor, pharmacist, orhealth care provider.  2022 Elsevier/Gold Standard (2016-07-01 20:21:10)  

## 2020-11-15 ENCOUNTER — Encounter (HOSPITAL_COMMUNITY)
Admission: RE | Admit: 2020-11-15 | Discharge: 2020-11-15 | Disposition: A | Discharge status: Home / Self Care | Payer: Medicare Other | Source: Ambulatory Visit | Attending: Nephrology | Admitting: Nephrology

## 2020-11-15 ENCOUNTER — Other Ambulatory Visit: Payer: Self-pay

## 2020-11-15 DIAGNOSIS — D631 Anemia in chronic kidney disease: Secondary | ICD-10-CM | POA: Diagnosis not present

## 2020-11-15 DIAGNOSIS — N184 Chronic kidney disease, stage 4 (severe): Secondary | ICD-10-CM | POA: Diagnosis not present

## 2020-11-15 MED ORDER — SODIUM CHLORIDE 0.9 % IV SOLN
510.0000 mg | INTRAVENOUS | Status: DC
  Administered 2020-11-15: 510 mg via INTRAVENOUS
  Filled 2020-11-15: qty 510

## 2020-12-26 DIAGNOSIS — I10 Essential (primary) hypertension: Secondary | ICD-10-CM | POA: Diagnosis not present

## 2020-12-26 DIAGNOSIS — N183 Chronic kidney disease, stage 3 unspecified: Secondary | ICD-10-CM | POA: Diagnosis not present

## 2020-12-26 DIAGNOSIS — R399 Unspecified symptoms and signs involving the genitourinary system: Secondary | ICD-10-CM | POA: Diagnosis not present

## 2021-02-15 DIAGNOSIS — N183 Chronic kidney disease, stage 3 unspecified: Secondary | ICD-10-CM | POA: Diagnosis not present

## 2021-02-15 DIAGNOSIS — N2581 Secondary hyperparathyroidism of renal origin: Secondary | ICD-10-CM | POA: Diagnosis not present

## 2021-02-15 DIAGNOSIS — I340 Nonrheumatic mitral (valve) insufficiency: Secondary | ICD-10-CM | POA: Diagnosis not present

## 2021-02-15 DIAGNOSIS — I509 Heart failure, unspecified: Secondary | ICD-10-CM | POA: Diagnosis not present

## 2021-02-15 DIAGNOSIS — D649 Anemia, unspecified: Secondary | ICD-10-CM | POA: Diagnosis not present

## 2021-02-15 DIAGNOSIS — I10 Essential (primary) hypertension: Secondary | ICD-10-CM | POA: Diagnosis not present

## 2021-02-15 DIAGNOSIS — R7303 Prediabetes: Secondary | ICD-10-CM | POA: Diagnosis not present

## 2021-03-25 ENCOUNTER — Encounter: Payer: Self-pay | Admitting: Cardiovascular Disease

## 2021-03-25 NOTE — Progress Notes (Signed)
Cardiology Office Note   Date:  03/26/2021   ID:  Julia [name], Julia [name] 17-Apr-1941, MRN [id number]  PCP:  London Pepper, MD  Cardiologist:   Mertie Moores, MD   Chief Complaint  Patient presents with   Congestive Heart Failure   Problem List:  1. Essential HTN 2. Chronic systolic CHF  - EF 32% -99% 3. Chronic renal disease - stage III 4. Severe MR by TEE     Seen with granddaughter, Julia [name] is a 80 y.o. female who presents for eval of CHF and severe MR .  Recently moved from [place].   She's been treated with medicines and feels quite a bit better. She's not had any surgical repair.     No acute issues  Potassium was elevated recenly and her Aldactone , Ramipril were stopped.  Fairly active for 76 years.  Does her own shopping and housework .   Sleeps with 2 pillows.   Not particularly short of breat with 1 pillow  Had significant CHF in 2015 while in [place] ( no dyspnea, just leg edema )  She originally presented with congestive heart failure. Evaluation revealed that she had normal coronary arteries. She was found have severe mitral regurgitation due to a flail leaflet of the mitral valve. She was also found to have anemia in chronic kidney disease.   Jan. 25, 2018:  Julia [name] is seen today for follow up visit Feeling well,  Echo showed moderate MR. LV function   Oct. 5, 2018:  Julia [name] is seen for follow up of her CHF and moderate MR .  Seen with Mateo Flow, Able to do all her normal activities  Walks regularly - about 45 minutes , no severe dyspnea  No syncope   November 06, 2017:   Julia [name] is seen today for follow-up visit.  She has a history of chronic diastolic congestive heart failure.  She has normal left ventricular systolic function.  There is mild prolapse of the anterior leaflet of the mitral valve.  BP is a bit elevated today .   Has been normal at home and at other doctor visits  Watches her salt .  No dyspnea,   Is not on lasix    March 08, 2019: Julia [name] is seen today for follow-up of her hypertension and chronic diastolic congestive heart failure.  She has mild mitral valve prolapse of the anterior leaflet.  BP is well controled typically .  No CP or dyspnea .   Oct. 31, 2022: Seen with daughter, Julia [name] is seen  for follow up of her HTN, chronic diastolic CHF Mild MVP of the anterior leaflet of the MV. Echocardiogram in December, 2021 reveals moderate mitral regurgitation.  Doing well breathing is ok Still walks some   Past Medical History:  Diagnosis Date   CKD (chronic kidney disease)    HFrEF (heart failure with reduced ejection fraction) (HCC)    Non-ischemic cardiomyopathy / Cath in 12/15 w normal coronary arteries  // Echocardiogram 11/15: EF 25 // TEE 1/16: EF 35-40 // Echocardiogram 8/17: EF 55-60    Hypertension    Mitral regurgitation    severe by TEE in 2015 // Echo in 8/17: mod MR // Echo 12/21: EF 60-65, no RWMA, mod LVH, Gr 2 DD, normal RVSF, RVSP 26.7, severe LAE, mild MVP (medial segment of ant leaflet), mod MR, mild MS (mean gradient 5 mmHg), mod calcification of AV, mild AI, mild-mod AV sclerosis w/o stenosis (mean gradient  11 mmHg), trivial post pericardial effusion    History reviewed. No pertinent surgical history.   Current Outpatient Medications  Medication Sig Dispense Refill   amLODipine (NORVASC) 5 MG tablet Take 5 mg by mouth daily.     carvedilol (COREG) 25 MG tablet Take 25 mg by mouth 2 (two) times daily.  3   rosuvastatin (CRESTOR) 5 MG tablet Take 1 tablet (5 mg total) by mouth daily. 90 tablet 3   No current facility-administered medications for this visit.    Allergies:   Penicillins    Social History:  The patient  reports that she has quit smoking. She has never used smokeless tobacco. She reports that she does not drink alcohol and does not use drugs.   Family History:  The patient's family history includes Alcohol abuse in her brother; Breast cancer in her  sister; Cancer in her mother; Congestive Heart Failure in her sister; Diabetes in her sister; Diverticulitis in her brother; Gout in her sister; Healthy in her brother, brother, and daughter; Hypertension in her father and sister; Kidney disease in her sister; Lung disease in her sister; Stroke in her father.    ROS: Noted in current history, otherwise review systems is negative.  Physical Exam: Blood pressure (!) 146/78, pulse 68, height 5\' 6"  (1.676 m), weight 152 lb 9.6 oz (69.2 kg), SpO2 99 %.  GEN:  Well nourished, well developed in no acute distress HEENT: Normal NECK: No JVD; No carotid bruits LYMPHATICS: No lymphadenopathy CARDIAC: RRR late systolic murmur at the left axillary line. RESPIRATORY:  Clear to auscultation without rales, wheezing or rhonchi  ABDOMEN: Soft, non-tender, non-distended MUSCULOSKELETAL:  No edema; No deformity  SKIN: Warm and dry NEUROLOGIC:  Alert and oriented x 3    EKG: March 26, 2021: Normal sinus rhythm at 66.  Right bundle branch block.  Recent Labs: No results found for requested labs within last 8760 hours.    Lipid Panel    Component Value Date/Time   CHOL 172 05/05/2020 1137   TRIG 66 05/05/2020 1137   HDL 53 05/05/2020 1137   CHOLHDL 3.2 05/05/2020 1137   LDLCALC 106 (H) 05/05/2020 1137      Wt Readings from Last 3 Encounters:  03/26/21 152 lb 9.6 oz (69.2 kg)  03/10/20 152 lb 12.8 oz (69.3 kg)  03/08/19 148 lb (67.1 kg)      Other studies Reviewed: Additional studies/ records that were reviewed today include: . Review of the above records demonstrates:    ASSESSMENT AND PLAN:  1.  Chronic systolic congestive heart failure:  Julia [name] seems to be doing very well.  She walks for 30 minutes each morning without any chest pain or shortness of breath.  Her exercise capacity has been unchanged.  2. Mitral regurgitation:    She has moderate mitral regurgitation by echo.  She has a systolic murmur radiating to her axillary line.   The exam sounds unchanged. We will plan on getting an echocardiogram in approximately 1 year.  We will have her see Richardson Dopp, PA about a week after that.  3. CKD -further plans per primary medical doctor.       Current medicines are reviewed at length with the patient today.  The patient does not have concerns regarding medicines.  Labs/ tests ordered today include:   Orders Placed This Encounter  Procedures   EKG 12-Lead   ECHOCARDIOGRAM COMPLETE      Mertie Moores, MD  03/26/2021 1:06 PM    [facility] [address], [place], [place]  [address] Phone: [phone]; Fax: [phone]

## 2021-03-26 ENCOUNTER — Ambulatory Visit (INDEPENDENT_AMBULATORY_CARE_PROVIDER_SITE_OTHER): Payer: Medicare Other | Admitting: Cardiovascular Disease

## 2021-03-26 ENCOUNTER — Other Ambulatory Visit: Payer: Self-pay

## 2021-03-26 ENCOUNTER — Encounter: Payer: Self-pay | Admitting: Cardiovascular Disease

## 2021-03-26 VITALS — BP 146/78 | HR 68 | Ht 66.0 in | Wt 152.6 lb

## 2021-03-26 DIAGNOSIS — I340 Nonrheumatic mitral (valve) insufficiency: Secondary | ICD-10-CM

## 2021-03-26 DIAGNOSIS — Z952 Presence of prosthetic heart valve: Secondary | ICD-10-CM

## 2021-03-26 NOTE — Patient Instructions (Signed)
Medication Instructions:  Your physician recommends that you continue on your current medications as directed. Please refer to the Current Medication list given to you today.  *If you need a refill on your cardiac medications before your next appointment, please call your pharmacy*   Lab Work: NONE If you have labs (blood work) drawn today and your tests are completely normal, you will receive your results only by: [place] (if you have MyChart) OR A paper copy in the mail If you have any lab test that is abnormal or we need to change your treatment, we will call you to review the results.   Testing/Procedures: Your physician has requested that you have an echocardiogram. Echocardiography is a painless test that uses sound waves to create images of your heart. It provides your doctor with information about the size and shape of your heart and how well your heart's chambers and valves are working. This procedure takes approximately one hour. There are no restrictions for this procedure. 1 WEEK PRIOR TO OFFICE VISIT   Follow-Up: At [facility], you and your health needs are our priority.  As part of our continuing mission to provide you with exceptional heart care, we have created designated Provider Care Teams.  These Care Teams include your primary Cardiologist (physician) and Advanced Practice Providers (APPs -  Physician Assistants and Nurse Practitioners) who all work together to provide you with the care you need, when you need it.  We recommend signing up for the patient portal called "MyChart".  Sign up information is provided on this After Visit Summary.  MyChart is used to connect with patients for Virtual Visits (Telemedicine).  Patients are able to view lab/test results, encounter notes, upcoming appointments, etc.  Non-urgent messages can be sent to your provider as well.   To learn more about what you can do with MyChart, go to [email or web address].    Your next  appointment:   1 year(s)  The format for your next appointment:   In Person  Provider:   You may see Mertie Moores, MD or one of the following Advanced Practice Providers on your designated Care Team:   Richardson Dopp, PA-C [address], [place]   Other Instructions Echocardiogram An echocardiogram is a test that uses sound waves (ultrasound) to produce images of the heart. Images from an echocardiogram can provide important information about: Heart size and shape. The size and thickness and movement of your heart's walls. Heart muscle function and strength. Heart valve function or if you have stenosis. Stenosis is when the heart valves are too narrow. If blood is flowing backward through the heart valves (regurgitation). A tumor or infectious growth around the heart valves. Areas of heart muscle that are not working well because of poor blood flow or injury from a heart attack. Aneurysm detection. An aneurysm is a weak or damaged part of an artery wall. The wall bulges out from the normal force of blood pumping through the body. Tell a health care provider about: Any allergies you have. All medicines you are taking, including vitamins, herbs, eye drops, creams, and over-the-counter medicines. Any blood disorders you have. Any surgeries you have had. Any medical conditions you have. Whether you are pregnant or may be pregnant. What are the risks? Generally, this is a safe test. However, problems may occur, including an allergic reaction to dye (contrast) that may be used during the test. What happens before the test? No specific preparation is needed. You may eat and drink normally. What  happens during the test?  You will take off your clothes from the waist up and put on a hospital gown. Electrodes or electrocardiogram (ECG)patches may be placed on your chest. The electrodes or patches are then connected to a device that monitors your heart rate and rhythm. You will lie down on a  table for an ultrasound exam. A gel will be applied to your chest to help sound waves pass through your skin. A handheld device, called a transducer, will be pressed against your chest and moved over your heart. The transducer produces sound waves that travel to your heart and bounce back (or "echo" back) to the transducer. These sound waves will be captured in real-time and changed into images of your heart that can be viewed on a video monitor. The images will be recorded on a computer and reviewed by your health care provider. You may be asked to change positions or hold your breath for a short time. This makes it easier to get different views or better views of your heart. In some cases, you may receive contrast through an IV in one of your veins. This can improve the quality of the pictures from your heart. The procedure may vary among health care providers and hospitals. What can I expect after the test? You may return to your normal, everyday life, including diet, activities, and medicines, unless your health care provider tells you not to do that. Follow these instructions at home: It is up to you to get the results of your test. Ask your health care provider, or the department that is doing the test, when your results will be ready. Keep all follow-up visits. This is important. Summary An echocardiogram is a test that uses sound waves (ultrasound) to produce images of the heart. Images from an echocardiogram can provide important information about the size and shape of your heart, heart muscle function, heart valve function, and other possible heart problems. You do not need to do anything to prepare before this test. You may eat and drink normally. After the echocardiogram is completed, you may return to your normal, everyday life, unless your health care provider tells you not to do that. This information is not intended to replace advice given to you by your health care provider. Make sure  you discuss any questions you have with your health care provider. Document Revised: 01/04/2020 Document Reviewed: 01/04/2020 Elsevier Patient Education  2022 [employer].

## 2021-08-15 DIAGNOSIS — R7309 Other abnormal glucose: Secondary | ICD-10-CM | POA: Diagnosis not present

## 2021-08-15 DIAGNOSIS — Z0000 Encounter for general adult medical examination without abnormal findings: Secondary | ICD-10-CM | POA: Diagnosis not present

## 2021-08-15 DIAGNOSIS — D649 Anemia, unspecified: Secondary | ICD-10-CM | POA: Diagnosis not present

## 2021-08-15 DIAGNOSIS — I10 Essential (primary) hypertension: Secondary | ICD-10-CM | POA: Diagnosis not present

## 2021-08-15 DIAGNOSIS — N183 Chronic kidney disease, stage 3 unspecified: Secondary | ICD-10-CM | POA: Diagnosis not present

## 2021-08-15 DIAGNOSIS — I509 Heart failure, unspecified: Secondary | ICD-10-CM | POA: Diagnosis not present

## 2021-08-15 DIAGNOSIS — N2581 Secondary hyperparathyroidism of renal origin: Secondary | ICD-10-CM | POA: Diagnosis not present

## 2021-10-31 DIAGNOSIS — N2581 Secondary hyperparathyroidism of renal origin: Secondary | ICD-10-CM | POA: Diagnosis not present

## 2021-10-31 DIAGNOSIS — I5030 Unspecified diastolic (congestive) heart failure: Secondary | ICD-10-CM | POA: Diagnosis not present

## 2021-10-31 DIAGNOSIS — D631 Anemia in chronic kidney disease: Secondary | ICD-10-CM | POA: Diagnosis not present

## 2021-10-31 DIAGNOSIS — I129 Hypertensive chronic kidney disease with stage 1 through stage 4 chronic kidney disease, or unspecified chronic kidney disease: Secondary | ICD-10-CM | POA: Diagnosis not present

## 2021-10-31 DIAGNOSIS — N184 Chronic kidney disease, stage 4 (severe): Secondary | ICD-10-CM | POA: Diagnosis not present

## 2021-11-14 ENCOUNTER — Ambulatory Visit (HOSPITAL_COMMUNITY)
Admission: RE | Admit: 2021-11-14 | Discharge: 2021-11-14 | Disposition: A | Discharge status: Home / Self Care | Payer: Medicare Other | Source: Ambulatory Visit | Attending: Nephrology | Admitting: Nephrology

## 2021-11-14 VITALS — BP 164/84 | HR 69 | Temp 96.5°F

## 2021-11-14 DIAGNOSIS — D638 Anemia in other chronic diseases classified elsewhere: Secondary | ICD-10-CM | POA: Insufficient documentation

## 2021-11-14 LAB — POCT HEMOGLOBIN-HEMACUE: Hemoglobin: 9.3 g/dL — ABNORMAL LOW (ref 12.0–15.0)

## 2021-11-14 MED ORDER — EPOETIN ALFA-EPBX 10000 UNIT/ML IJ SOLN
INTRAMUSCULAR | Status: AC
  Administered 2021-11-14: 10000 [IU] via SUBCUTANEOUS
  Filled 2021-11-14: qty 1

## 2021-11-14 MED ORDER — EPOETIN ALFA 10000 UNIT/ML IJ SOLN: 10000.0000 [IU] | INTRAMUSCULAR | Status: DC

## 2021-11-20 ENCOUNTER — Encounter (HOSPITAL_COMMUNITY): Payer: Medicare Other

## 2021-11-28 ENCOUNTER — Encounter (HOSPITAL_COMMUNITY): Payer: Medicare Other

## 2021-11-28 ENCOUNTER — Other Ambulatory Visit (HOSPITAL_COMMUNITY): Payer: Self-pay

## 2021-11-30 ENCOUNTER — Encounter (HOSPITAL_COMMUNITY)
Admission: RE | Admit: 2021-11-30 | Discharge: 2021-11-30 | Disposition: A | Discharge status: Home / Self Care | Payer: Medicare Other | Source: Ambulatory Visit | Attending: Nephrology | Admitting: Nephrology

## 2021-11-30 ENCOUNTER — Encounter (HOSPITAL_COMMUNITY): Payer: Medicare Other

## 2021-11-30 VITALS — BP 142/74 | HR 64 | Temp 97.5°F | Resp 19

## 2021-11-30 DIAGNOSIS — D638 Anemia in other chronic diseases classified elsewhere: Secondary | ICD-10-CM | POA: Insufficient documentation

## 2021-11-30 DIAGNOSIS — D631 Anemia in chronic kidney disease: Secondary | ICD-10-CM | POA: Diagnosis not present

## 2021-11-30 DIAGNOSIS — N189 Chronic kidney disease, unspecified: Secondary | ICD-10-CM | POA: Diagnosis not present

## 2021-11-30 LAB — POCT HEMOGLOBIN-HEMACUE: Hemoglobin: 9.5 g/dL — ABNORMAL LOW (ref 12.0–15.0)

## 2021-11-30 MED ORDER — EPOETIN ALFA 10000 UNIT/ML IJ SOLN
10000.0000 [IU] | INTRAMUSCULAR | Status: DC
  Administered 2021-11-30: 10000 [IU] via SUBCUTANEOUS

## 2021-11-30 MED ORDER — EPOETIN ALFA-EPBX 10000 UNIT/ML IJ SOLN: 10000.0000 [IU] | INTRAMUSCULAR | Status: DC

## 2021-11-30 MED ORDER — EPOETIN ALFA 10000 UNIT/ML IJ SOLN
INTRAMUSCULAR | Status: AC
  Filled 2021-11-30: qty 1

## 2021-11-30 MED ORDER — SODIUM CHLORIDE 0.9 % IV SOLN
510.0000 mg | Freq: Once | INTRAVENOUS | Status: AC
  Administered 2021-11-30: 510 mg via INTRAVENOUS
  Filled 2021-11-30: qty 510

## 2021-12-14 ENCOUNTER — Encounter (HOSPITAL_COMMUNITY)
Admission: RE | Admit: 2021-12-14 | Discharge: 2021-12-14 | Disposition: A | Discharge status: Home / Self Care | Payer: Medicare Other | Source: Ambulatory Visit | Attending: Nephrology | Admitting: Nephrology

## 2021-12-14 VITALS — BP 143/78 | HR 65 | Temp 97.1°F | Resp 18

## 2021-12-14 DIAGNOSIS — N189 Chronic kidney disease, unspecified: Secondary | ICD-10-CM | POA: Diagnosis not present

## 2021-12-14 DIAGNOSIS — D638 Anemia in other chronic diseases classified elsewhere: Secondary | ICD-10-CM

## 2021-12-14 LAB — IRON AND TIBC
Iron: 53 ug/dL (ref 28–170)
Saturation Ratios: 23 % (ref 10.4–31.8)
TIBC: 232 ug/dL — ABNORMAL LOW (ref 250–450)
UIBC: 179 ug/dL

## 2021-12-14 LAB — FERRITIN: Ferritin: 665 ng/mL — ABNORMAL HIGH (ref 11–307)

## 2021-12-14 LAB — POCT HEMOGLOBIN-HEMACUE: Hemoglobin: 10.4 g/dL — ABNORMAL LOW (ref 12.0–15.0)

## 2021-12-14 MED ORDER — EPOETIN ALFA 10000 UNIT/ML IJ SOLN
10000.0000 [IU] | Freq: Once | INTRAMUSCULAR | Status: AC
  Administered 2021-12-14: 10000 [IU] via SUBCUTANEOUS

## 2021-12-14 MED ORDER — EPOETIN ALFA 10000 UNIT/ML IJ SOLN
INTRAMUSCULAR | Status: AC
  Filled 2021-12-14: qty 1

## 2021-12-14 MED ORDER — EPOETIN ALFA-EPBX 10000 UNIT/ML IJ SOLN: 10000.0000 [IU] | INTRAMUSCULAR | Status: DC

## 2021-12-28 ENCOUNTER — Encounter (HOSPITAL_COMMUNITY): Payer: Medicare Other

## 2022-01-03 ENCOUNTER — Encounter (HOSPITAL_COMMUNITY)
Admission: RE | Admit: 2022-01-03 | Discharge: 2022-01-03 | Disposition: A | Discharge status: Home / Self Care | Payer: Medicare Other | Source: Ambulatory Visit | Attending: Nephrology | Admitting: Nephrology

## 2022-01-03 VITALS — BP 130/73 | HR 63 | Temp 97.3°F | Resp 18

## 2022-01-03 DIAGNOSIS — D631 Anemia in chronic kidney disease: Secondary | ICD-10-CM | POA: Insufficient documentation

## 2022-01-03 DIAGNOSIS — I129 Hypertensive chronic kidney disease with stage 1 through stage 4 chronic kidney disease, or unspecified chronic kidney disease: Secondary | ICD-10-CM | POA: Diagnosis not present

## 2022-01-03 DIAGNOSIS — D638 Anemia in other chronic diseases classified elsewhere: Secondary | ICD-10-CM | POA: Diagnosis not present

## 2022-01-03 DIAGNOSIS — N183 Chronic kidney disease, stage 3 unspecified: Secondary | ICD-10-CM | POA: Diagnosis not present

## 2022-01-03 LAB — POCT HEMOGLOBIN-HEMACUE: Hemoglobin: 10.6 g/dL — ABNORMAL LOW (ref 12.0–15.0)

## 2022-01-03 MED ORDER — EPOETIN ALFA-EPBX 10000 UNIT/ML IJ SOLN: 10000.0000 [IU] | INTRAMUSCULAR | Status: DC

## 2022-01-03 MED ORDER — EPOETIN ALFA 10000 UNIT/ML IJ SOLN
10000.0000 [IU] | Freq: Once | INTRAMUSCULAR | Status: AC
  Administered 2022-01-03: 10000 [IU] via SUBCUTANEOUS

## 2022-01-03 MED ORDER — EPOETIN ALFA 10000 UNIT/ML IJ SOLN
INTRAMUSCULAR | Status: AC
  Filled 2022-01-03: qty 1

## 2022-01-11 ENCOUNTER — Encounter (HOSPITAL_COMMUNITY): Payer: Medicare Other

## 2022-01-17 ENCOUNTER — Encounter (HOSPITAL_COMMUNITY): Payer: Medicare Other

## 2022-01-23 ENCOUNTER — Encounter (HOSPITAL_COMMUNITY)
Admission: RE | Admit: 2022-01-23 | Discharge: 2022-01-23 | Disposition: A | Discharge status: Home / Self Care | Payer: Medicare Other | Source: Ambulatory Visit | Attending: Nephrology | Admitting: Nephrology

## 2022-01-23 VITALS — BP 142/73 | HR 65 | Temp 96.9°F

## 2022-01-23 DIAGNOSIS — D638 Anemia in other chronic diseases classified elsewhere: Secondary | ICD-10-CM

## 2022-01-23 DIAGNOSIS — I129 Hypertensive chronic kidney disease with stage 1 through stage 4 chronic kidney disease, or unspecified chronic kidney disease: Secondary | ICD-10-CM | POA: Diagnosis not present

## 2022-01-23 LAB — FERRITIN: Ferritin: 615 ng/mL — ABNORMAL HIGH (ref 11–307)

## 2022-01-23 LAB — IRON AND TIBC
Iron: 63 ug/dL (ref 28–170)
Saturation Ratios: 29 % (ref 10.4–31.8)
TIBC: 221 ug/dL — ABNORMAL LOW (ref 250–450)
UIBC: 158 ug/dL

## 2022-01-23 MED ORDER — EPOETIN ALFA 10000 UNIT/ML IJ SOLN: 10000.0000 [IU] | Freq: Once | INTRAMUSCULAR | Status: AC

## 2022-01-23 MED ORDER — EPOETIN ALFA 10000 UNIT/ML IJ SOLN
INTRAMUSCULAR | Status: AC
  Administered 2022-01-23: 10000 [IU] via SUBCUTANEOUS
  Filled 2022-01-23: qty 1

## 2022-01-23 MED ORDER — EPOETIN ALFA-EPBX 10000 UNIT/ML IJ SOLN: 10000.0000 [IU] | INTRAMUSCULAR | Status: DC

## 2022-01-31 ENCOUNTER — Encounter (HOSPITAL_COMMUNITY): Payer: Medicare Other

## 2022-02-06 ENCOUNTER — Encounter (HOSPITAL_COMMUNITY): Payer: Medicare Other

## 2022-02-06 DIAGNOSIS — D649 Anemia, unspecified: Secondary | ICD-10-CM | POA: Diagnosis not present

## 2022-02-06 DIAGNOSIS — I10 Essential (primary) hypertension: Secondary | ICD-10-CM | POA: Diagnosis not present

## 2022-02-06 DIAGNOSIS — I509 Heart failure, unspecified: Secondary | ICD-10-CM | POA: Diagnosis not present

## 2022-02-06 DIAGNOSIS — R7303 Prediabetes: Secondary | ICD-10-CM | POA: Diagnosis not present

## 2022-02-06 DIAGNOSIS — N184 Chronic kidney disease, stage 4 (severe): Secondary | ICD-10-CM | POA: Diagnosis not present

## 2022-02-08 ENCOUNTER — Encounter (HOSPITAL_COMMUNITY): Payer: Self-pay | Admitting: Cardiovascular Disease

## 2022-02-08 ENCOUNTER — Encounter (HOSPITAL_COMMUNITY): Payer: Medicare Other

## 2022-02-11 DIAGNOSIS — R7303 Prediabetes: Secondary | ICD-10-CM | POA: Diagnosis not present

## 2022-02-11 DIAGNOSIS — I129 Hypertensive chronic kidney disease with stage 1 through stage 4 chronic kidney disease, or unspecified chronic kidney disease: Secondary | ICD-10-CM | POA: Diagnosis not present

## 2022-02-11 DIAGNOSIS — D631 Anemia in chronic kidney disease: Secondary | ICD-10-CM | POA: Diagnosis not present

## 2022-02-11 DIAGNOSIS — N2581 Secondary hyperparathyroidism of renal origin: Secondary | ICD-10-CM | POA: Diagnosis not present

## 2022-02-11 DIAGNOSIS — N184 Chronic kidney disease, stage 4 (severe): Secondary | ICD-10-CM | POA: Diagnosis not present

## 2022-02-11 DIAGNOSIS — I5030 Unspecified diastolic (congestive) heart failure: Secondary | ICD-10-CM | POA: Diagnosis not present

## 2022-02-12 LAB — POCT HEMOGLOBIN-HEMACUE: Hemoglobin: 10.7 g/dL — ABNORMAL LOW (ref 12.0–15.0)

## 2022-02-14 ENCOUNTER — Encounter (HOSPITAL_COMMUNITY): Payer: Medicare Other

## 2022-02-15 ENCOUNTER — Telehealth (HOSPITAL_COMMUNITY): Payer: Self-pay | Admitting: Cardiovascular Disease

## 2022-02-15 NOTE — Telephone Encounter (Signed)
Just an FYI. We have made several attempts to contact this patient including sending a letter to schedule or reschedule their echocardiogram. We will be removing the patient from the echo WQ.  MAILED LETTER [facility] 02/08/22  02/08/22 [facility] to schedule @ 9:58/LBW  02/05/22 [facility] to schedule @ 11:52/LBW  01/22/22 [facility] to schedule for October /LBW  echo due 2023. pt will call to schedule echo.      Thank you

## 2022-02-22 ENCOUNTER — Encounter (HOSPITAL_COMMUNITY): Payer: Medicare Other

## 2022-03-25 ENCOUNTER — Encounter: Payer: Self-pay | Admitting: Cardiovascular Disease

## 2022-03-25 NOTE — Progress Notes (Signed)
This encounter was created in error - please disregard.

## 2022-03-26 ENCOUNTER — Encounter: Payer: Medicare Other | Admitting: Cardiovascular Disease

## 2022-04-16 ENCOUNTER — Encounter: Payer: Medicare Other | Attending: Nephrology | Admitting: Cardiovascular Disease

## 2022-04-16 ENCOUNTER — Encounter: Payer: Self-pay | Admitting: Cardiovascular Disease

## 2022-04-16 VITALS — BP 160/90 | HR 63 | Ht 65.0 in | Wt 152.0 lb

## 2022-04-16 DIAGNOSIS — I10 Essential (primary) hypertension: Secondary | ICD-10-CM | POA: Insufficient documentation

## 2022-04-16 DIAGNOSIS — E785 Hyperlipidemia, unspecified: Secondary | ICD-10-CM | POA: Diagnosis not present

## 2022-04-16 DIAGNOSIS — Z952 Presence of prosthetic heart valve: Secondary | ICD-10-CM | POA: Diagnosis not present

## 2022-04-16 NOTE — Patient Instructions (Signed)
Medication Instructions:  Your physician recommends that you continue on your current medications as directed. Please refer to the Current Medication list given to you today.  *If you need a refill on your cardiac medications before your next appointment, please call your pharmacy*   Lab Work: NONE If you have labs (blood work) drawn today and your tests are completely normal, you will receive your results only by: [place] (if you have MyChart) OR A paper copy in the mail If you have any lab test that is abnormal or we need to change your treatment, we will call you to review the results.   Testing/Procedures: **Monitor BP and record. Please bring with you to office visit in 2 months**   Follow-Up: At [facility], you and your health needs are our priority.  As part of our continuing mission to provide you with exceptional heart care, we have created designated Provider Care Teams.  These Care Teams include your primary Cardiologist (physician) and Advanced Practice Providers (APPs -  Physician Assistants and Nurse Practitioners) who all work together to provide you with the care you need, when you need it.  Your next appointment:   2 month(s)  The format for your next appointment:   In Person  Provider:   Christen Bame, NP      Important Information About Sugar

## 2022-04-16 NOTE — Progress Notes (Signed)
Cardiology Office Note   Date:  04/16/2022   ID:  Julia, [name] 1940-12-26, MRN [id number]  PCP:  London Pepper, MD  Cardiologist:   Mertie Moores, MD   Chief Complaint  Patient presents with   Congestive Heart Failure   Hyperlipidemia   Problem List:  1. Essential HTN 2. Chronic systolic CHF  - EF 91% -50% 3. Chronic renal disease - stage III 4. Severe MR by TEE     Seen with granddaughter, Julia [name] is a 81 y.o. female who presents for eval of CHF and severe MR .  Recently moved from [place].   She's been treated with medicines and feels quite a bit better. She's not had any surgical repair.     No acute issues  Potassium was elevated recenly and her Aldactone , Ramipril were stopped.  Fairly active for 76 years.  Does her own shopping and housework .   Sleeps with 2 pillows.   Not particularly short of breat with 1 pillow  Had significant CHF in 2015 while in [place] ( no dyspnea, just leg edema )  She originally presented with congestive heart failure. Evaluation revealed that she had normal coronary arteries. She was found have severe mitral regurgitation due to a flail leaflet of the mitral valve. She was also found to have anemia in chronic kidney disease.   Jan. 25, 2018:  Julia [name] is seen today for follow up visit Feeling well,  Echo showed moderate MR. LV function   Oct. 5, 2018:  Julia [name] is seen for follow up of her CHF and moderate MR .  Seen with Julia [name], Able to do all her normal activities  Walks regularly - about 45 minutes , no severe dyspnea  No syncope   November 06, 2017:   Julia [name] is seen today for follow-up visit.  She has a history of chronic diastolic congestive heart failure.  She has normal left ventricular systolic function.  There is mild prolapse of the anterior leaflet of the mitral valve.  BP is a bit elevated today .   Has been normal at home and at other doctor visits  Watches her salt .  No dyspnea,   Is not on  lasix    March 08, 2019: Julia [name] is seen today for follow-up of her hypertension and chronic diastolic congestive heart failure.  She has mild mitral valve prolapse of the anterior leaflet.  BP is well controled typically .  No CP or dyspnea .   Oct. 31, 2022: Seen with daughter, Julia [name] is seen  for follow up of her HTN, chronic diastolic CHF Mild MVP of the anterior leaflet of the MV. Echocardiogram in December, 2021 reveals moderate mitral regurgitation.  Doing well breathing is ok Still walks some   Oct. 31, 2023 Pt canceled appt   Nov. 21, 2023  Seen with daughter, Julia [name] is seen for follow up of her HTN, chronic diastlic CHF, MVP    Past Medical History:  Diagnosis Date   CKD (chronic kidney disease)    HFrEF (heart failure with reduced ejection fraction) ([place])    Non-ischemic cardiomyopathy / Cath in 12/15 w normal coronary arteries  // Echocardiogram 11/15: EF 25 // TEE 1/16: EF 35-40 // Echocardiogram 8/17: EF 55-60    Hypertension    Mitral regurgitation    severe by TEE in 2015 // Echo in 8/17: mod MR // Echo 12/21: EF 60-65, no RWMA, mod LVH, Gr  2 DD, normal RVSF, RVSP 26.7, severe LAE, mild MVP (medial segment of ant leaflet), mod MR, mild MS (mean gradient 5 mmHg), mod calcification of AV, mild AI, mild-mod AV sclerosis w/o stenosis (mean gradient 11 mmHg), trivial post pericardial effusion    History reviewed. No pertinent surgical history.   Current Outpatient Medications  Medication Sig Dispense Refill   amLODipine (NORVASC) 5 MG tablet Take 5 mg by mouth daily.     carvedilol (COREG) 25 MG tablet Take 25 mg by mouth 2 (two) times daily.  3   No current facility-administered medications for this visit.    Allergies:   Penicillins    Social History:  The patient  reports that she has quit smoking. She has never used smokeless tobacco. She reports that she does not drink alcohol and does not use drugs.   Family History:  The  patient's family history includes Alcohol abuse in her brother; Breast cancer in her sister; Cancer in her mother; Congestive Heart Failure in her sister; Diabetes in her sister; Diverticulitis in her brother; Gout in her sister; Healthy in her brother, brother, and daughter; Hypertension in her father and sister; Kidney disease in her sister; Lung disease in her sister; Stroke in her father.    ROS: Noted in current history, otherwise review systems is negative.  Physical Exam: Blood pressure (!) 160/90, pulse 63, height 5\' 5"  (1.651 m), weight 152 lb (68.9 kg), SpO2 98 %.  HYPERTENSION CONTROL Vitals:   04/16/22 1417 04/16/22 1439  BP: (!) 166/84 (!) 160/90    The patient's blood pressure is elevated above target today.  In order to address the patient's elevated BP: Blood pressure will be monitored at home to determine if medication changes need to be made.       GEN:  Well nourished, well developed in no acute distress HEENT: Normal NECK: No JVD; No carotid bruits LYMPHATICS: No lymphadenopathy CARDIAC: RRR 2-9/5 systolic murmur  RESPIRATORY:  Clear to auscultation without rales, wheezing or rhonchi  ABDOMEN: Soft, non-tender, non-distended MUSCULOSKELETAL:  No edema; No deformity  SKIN: Warm and dry NEUROLOGIC:  Alert and oriented x 3    EKG: April 16, 2022: Normal sinus rhythm at 63.  First-degree AV block.  Right bundle branch block.  Recent Labs: 01/23/2022: Hemoglobin 10.7    Lipid Panel    Component Value Date/Time   CHOL 172 05/05/2020 1137   TRIG 66 05/05/2020 1137   HDL 53 05/05/2020 1137   CHOLHDL 3.2 05/05/2020 1137   LDLCALC 106 (H) 05/05/2020 1137      Wt Readings from Last 3 Encounters:  04/16/22 152 lb (68.9 kg)  03/26/21 152 lb 9.6 oz (69.2 kg)  03/10/20 152 lb 12.8 oz (69.3 kg)      Other studies Reviewed: Additional studies/ records that were reviewed today include: . Review of the above records demonstrates:    ASSESSMENT AND  PLAN:  1.  Chronic systolic congestive heart failure:  Seems to be stable     2. Mitral regurgitation:      stable exam   3. CKD -   4.   HTN :  bp is mildly elevated.  She will monitor and bring her BP log with her  Will have her see Christen Bame, NP in 2 months for follow up         Current medicines are reviewed at length with the patient today.  The patient does not have concerns regarding medicines.  Labs/ tests ordered  today include:   No orders of the defined types were placed in this encounter.     Mertie Moores, MD  04/16/2022 2:53 PM    [address], [place], [place]  [address] Phone: [phone]; Fax: [phone]

## 2022-05-24 DIAGNOSIS — N184 Chronic kidney disease, stage 4 (severe): Secondary | ICD-10-CM | POA: Diagnosis not present

## 2022-05-28 DIAGNOSIS — D631 Anemia in chronic kidney disease: Secondary | ICD-10-CM | POA: Diagnosis not present

## 2022-05-28 DIAGNOSIS — I5030 Unspecified diastolic (congestive) heart failure: Secondary | ICD-10-CM | POA: Diagnosis not present

## 2022-05-28 DIAGNOSIS — R7303 Prediabetes: Secondary | ICD-10-CM | POA: Diagnosis not present

## 2022-05-28 DIAGNOSIS — N2581 Secondary hyperparathyroidism of renal origin: Secondary | ICD-10-CM | POA: Diagnosis not present

## 2022-05-28 DIAGNOSIS — N184 Chronic kidney disease, stage 4 (severe): Secondary | ICD-10-CM | POA: Diagnosis not present

## 2022-05-28 DIAGNOSIS — I129 Hypertensive chronic kidney disease with stage 1 through stage 4 chronic kidney disease, or unspecified chronic kidney disease: Secondary | ICD-10-CM | POA: Diagnosis not present

## 2022-06-14 NOTE — Progress Notes (Deleted)
Cardiology Office Note:    Date:  06/14/2022   ID:  Julia [name], DOB 1940-06-16, MRN [id number]  PCP:  London Pepper, MD   [facility] Providers Cardiologist:  Mertie Moores, MD { Click to update primary MD,subspecialty MD or APP then REFRESH:1}    Referring MD: London Pepper, MD   Chief Complaint: ***  History of Present Illness:    Julia [name] is a *** 82 y.o. female with a hx of hypertension, chronic combined CHF, CKD, severe MR, RBBB  She established care with Dr. Acie Fredrickson in 2017 when she moved from [place].  She reported significant issues with CHF in 2015 while in [place] (no dyspnea, just leg edema).  She presented at that time with congestive heart failure, evaluation revealed that she had normal coronary arteries.  Was found to have severe mitral regurgitation due to a flail leaflet of the mitral valve.  Was also found to have anemia and CKD. When she established care with Dr. Acie Fredrickson, echo showed moderate MR. She was active at home as well as walking for exercise. She has maintained consistent follow-up.  Last cardiology clinic visit was 04/16/2022 with Dr. Acie Fredrickson. She was seen with her daughter Julia [name]. She has no specific cardiac complaints. Her BP was elevated at 160/90 and she was advised to continue to monitor BP at home and return in 2 months for follow-up with her BP log.   Today, she is here   Creatinine is 1.6  On amlodipine , coreg  Would try low dose valsartan if BP is elevated. , check BMP several weeks after   Past Medical History:  Diagnosis Date   CKD (chronic kidney disease)    HFrEF (heart failure with reduced ejection fraction) (HCC)    Non-ischemic cardiomyopathy / Cath in 12/15 w normal coronary arteries  // Echocardiogram 11/15: EF 25 // TEE 1/16: EF 35-40 // Echocardiogram 8/17: EF 55-60    Hypertension    Mitral regurgitation    severe by TEE in 2015 // Echo in 8/17: mod MR // Echo 12/21: EF 60-65, no RWMA, mod LVH, Gr 2 DD, normal RVSF, RVSP  26.7, severe LAE, mild MVP (medial segment of ant leaflet), mod MR, mild MS (mean gradient 5 mmHg), mod calcification of AV, mild AI, mild-mod AV sclerosis w/o stenosis (mean gradient 11 mmHg), trivial post pericardial effusion    No past surgical history on file.  Current Medications: No outpatient medications have been marked as taking for the 06/18/22 encounter (Appointment) with Ann Maki, Lanice Schwab, NP.     Allergies:   Penicillins   Social History   Socioeconomic History   Marital status: Widowed    Spouse name: Not on file   Number of children: Not on file   Years of education: Not on file   Highest education level: Not on file  Occupational History   Not on file  Tobacco Use   Smoking status: Former   Smokeless tobacco: Never  Vaping Use   Vaping Use: Never used  Substance and Sexual Activity   Alcohol use: No   Drug use: No   Sexual activity: Not on file  Other Topics Concern   Not on file  Social History Narrative   Not on file   Social Determinants of Health   Financial Resource Strain: Not on file  Food Insecurity: Not on file  Transportation Needs: Not on file  Physical Activity: Not on file  Stress: Not on file  Social Connections: Not on  file     Family History: The patient's ***family history includes Alcohol abuse in her brother; Breast cancer in her sister; Cancer in her mother; Congestive Heart Failure in her sister; Diabetes in her sister; Diverticulitis in her brother; Gout in her sister; Healthy in her brother, brother, and daughter; Hypertension in her father and sister; Kidney disease in her sister; Lung disease in her sister; Stroke in her father.  ROS:   Please see the history of present illness.    *** All other systems reviewed and are negative.  Labs/Other Studies Reviewed:    The following studies were reviewed today:  Echo 05/05/20 1. Left ventricular ejection fraction, by estimation, is 60 to 65%. The  left ventricle has normal  function. The left ventricle has no regional  wall motion abnormalities. The left ventricular internal cavity size was  mildly dilated. There is moderate  left ventricular hypertrophy of the posterior segment. Left ventricular  diastolic parameters are consistent with Grade II diastolic dysfunction  (pseudonormalization). Elevated left ventricular end-diastolic pressure.   2. Right ventricular systolic function is normal. The right ventricular  size is normal. There is normal pulmonary artery systolic pressure. The  estimated right ventricular systolic pressure is [id number] mmHg.   3. Left atrial size was severely dilated.   4. The mitral valve is degenerative. There is mild holosystolic prolapse  of the medial segment of the anterior leaflet of the mitral valve.  Moderate mitral valve regurgitation. Mild mitral stenosis. The mean mitral  valve gradient is 5.0 mmHg at a HR of  62bpm.   5. The aortic valve is tricuspid. There is moderate calcification of the  aortic valve. There is moderate thickening of the aortic valve. Aortic  valve regurgitation is mild. Mild to moderate aortic valve  sclerosis/calcification is present, without any  evidence of aortic stenosis. Aortic regurgitation PHT measures 461 msec.  Aortic valve area, by VTI measures 1.21 cm. Aortic valve mean gradient  measures 11.0 mmHg. Aortic valve Vmax measures 2.13 m/s.   6. The inferior vena cava is normal in size with <50% respiratory  variability, suggesting right atrial pressure of 8 mmHg.   7. The pericardial effusion is posterior to the left ventricle.   Carotid Duplex 04/05/20 Right Carotid: Velocities in the right ICA are consistent with a 1-39%  stenosis.   Left Carotid: Velocities in the left ICA are consistent with a 1-39%  stenosis.   Vertebrals: Bilateral vertebral arteries demonstrate antegrade [name].  Subclavians: Normal [name] hemodynamics were seen in bilateral subclavian               arteries.   *See  table(s) above for measurements and observations.   Cardiac catheterization 2015 in [place] Normal coronary arteries LVEF 25%   Recent Labs: 01/23/2022: Hemoglobin 10.7  Recent Lipid Panel    Component Value Date/Time   CHOL 172 05/05/2020 1137   TRIG 66 05/05/2020 1137   HDL 53 05/05/2020 1137   CHOLHDL 3.2 05/05/2020 1137   LDLCALC 106 (H) 05/05/2020 1137     Risk Assessment/Calculations:   {Does this patient have ATRIAL FIBRILLATION?[phone]}       Physical Exam:    VS:  There were no vitals taken for this visit.    Wt Readings from Last 3 Encounters:  04/16/22 152 lb (68.9 kg)  03/26/21 152 lb 9.6 oz (69.2 kg)  03/10/20 152 lb 12.8 oz (69.3 kg)     GEN: *** Well nourished, well developed in no acute distress HEENT:  Normal NECK: No JVD; No carotid bruits CARDIAC: ***RRR, no murmurs, rubs, gallops RESPIRATORY:  Clear to auscultation without rales, wheezing or rhonchi  ABDOMEN: Soft, non-tender, non-distended MUSCULOSKELETAL:  No edema; No deformity. *** pedal pulses, ***bilaterally SKIN: Warm and dry NEUROLOGIC:  Alert and oriented x 3 PSYCHIATRIC:  Normal affect   EKG:  EKG is *** ordered today.  The ekg ordered today demonstrates ***  No BP recorded.  {Refresh Note OR Click here to enter BP  :1}***    Diagnoses:    No diagnosis found. Assessment and Plan:     Chronic combined CHF: LVEF 60-65%, moderate LVH, G2DD on echo 04/2020  Mitral regurgitation: Mild holosystolic prolapse of medial segment of anterior leaflet of MV with moderate MR   Hypertension:   {Are you ordering a CV Procedure (e.g. stress test, cath, DCCV, TEE, etc)?   Press F2        :[id number]   Disposition:  Medication Adjustments/Labs and Tests Ordered: Current medicines are reviewed at length with the patient today.  Concerns regarding medicines are outlined above.  No orders of the defined types were placed in this encounter.  No orders of the defined types were placed in this  encounter.   There are no Patient Instructions on file for this visit.   Signed, Emmaline Life, NP  06/14/2022 1:09 PM    [place]

## 2022-06-18 ENCOUNTER — Ambulatory Visit: Payer: Medicare Other | Admitting: Nurse Practitioner

## 2022-06-24 ENCOUNTER — Inpatient Hospital Stay (HOSPITAL_COMMUNITY): Admission: RE | Admit: 2022-06-24 | Payer: Medicare Other | Source: Ambulatory Visit

## 2022-06-24 ENCOUNTER — Encounter (HOSPITAL_COMMUNITY): Payer: Self-pay

## 2022-06-27 ENCOUNTER — Encounter (HOSPITAL_COMMUNITY)
Admission: RE | Admit: 2022-06-27 | Discharge: 2022-06-27 | Disposition: A | Discharge status: Home / Self Care | Payer: Medicare Other | Source: Ambulatory Visit | Attending: Nephrology | Admitting: Nephrology

## 2022-06-27 VITALS — BP 129/73 | HR 69 | Temp 97.4°F | Resp 18

## 2022-06-27 DIAGNOSIS — D638 Anemia in other chronic diseases classified elsewhere: Secondary | ICD-10-CM | POA: Insufficient documentation

## 2022-06-27 LAB — POCT HEMOGLOBIN-HEMACUE: Hemoglobin: 9.2 g/dL — ABNORMAL LOW (ref 12.0–15.0)

## 2022-06-27 MED ORDER — EPOETIN ALFA-EPBX 10000 UNIT/ML IJ SOLN
INTRAMUSCULAR | Status: AC
  Filled 2022-06-27: qty 1

## 2022-06-27 MED ORDER — EPOETIN ALFA-EPBX 10000 UNIT/ML IJ SOLN
10000.0000 [IU] | INTRAMUSCULAR | Status: DC
  Administered 2022-06-27: 10000 [IU] via SUBCUTANEOUS

## 2022-06-27 NOTE — Progress Notes (Signed)
Office Visit    Patient Name: Julia [name] Date of Encounter: 06/27/2022  Primary Care Provider:  London Pepper, MD Primary Cardiologist:  Mertie Moores, MD Primary Electrophysiologist: None  Chief Complaint    Julia [name] is a 82 y.o. female with PMH of HFrEF, nonischemic cardiomyopathy, CKD stage III, HTN, mitral regurgitation who presents today for follow-up of elevated blood pressure.  Past Medical History    Past Medical History:  Diagnosis Date   CKD (chronic kidney disease)    HFrEF (heart failure with reduced ejection fraction) ([place])    Non-ischemic cardiomyopathy / Cath in 12/15 w normal coronary arteries  // Echocardiogram 11/15: EF 25 // TEE 1/16: EF 35-40 // Echocardiogram 8/17: EF 55-60    Hypertension    Mitral regurgitation    severe by TEE in 2015 // Echo in 8/17: mod MR // Echo 12/21: EF 60-65, no RWMA, mod LVH, Gr 2 DD, normal RVSF, RVSP 26.7, severe LAE, mild MVP (medial segment of ant leaflet), mod MR, mild MS (mean gradient 5 mmHg), mod calcification of AV, mild AI, mild-mod AV sclerosis w/o stenosis (mean gradient 11 mmHg), trivial post pericardial effusion   No past surgical history on file.  Allergies  Allergies  Allergen Reactions   Penicillins Other (See Comments)    UNSPECIFIED REACTION  Has patient had a PCN reaction causing immediate rash, facial/tongue/throat swelling, SOB or lightheadedness with hypotension: No Has patient had a PCN reaction causing severe rash involving mucus membranes or skin necrosis: No Has patient had a PCN reaction that required hospitalization No Has patient had a PCN reaction occurring within the last 10 years: No If all of the above answers are "NO", then may proceed with Cephalosporin use.     History of Present Illness    Julia [name]  is a 82 year old female with the above mention past medical history who presents today for elevated blood pressures.  Julia [name] has been followed by Dr. Acie Fredrickson since 2017  when she was seen for management of significant heart failure following her moving from [place] to [place].  She had an ischemic evaluation completed that showed normal coronaries in 2015 and [place]. 2D echo was also completed showing EF of 35-40% with severe mitral regurgitation. She had a TEE completed in 2016 that verified severe mitral regurgitation.  GDMT was optimized and 2D echo was completed most recently in 2021 that showed recovered EF of 60-65% with no RWMA and moderate LVH with grade 2 DD. She had carotid ultrasound completed 02/2020 showed mild carotid plaque and no follow-up necessary.  She was last seen by Dr. Acie Fredrickson on 04/16/2022 for routine follow-up.  Her blood pressure during visit was in the 409W systolically and patient was advised to monitor blood pressures and bring log and to follow-up in 2 months.  Julia [name] presents today for follow-up of her hypertension with her granddaughter.  Since last being seen in the office patient reports that she has been doing well and has eliminated lots of high sodium foods from her diet.  Her blood pressure today is 132/60 and in reviewing her log her blood pressures have been in the 130s to 140s at home.  She is currently not participating in an exercise program but plans to increase her physical activity outside with a walking program when weather permits.  She is euvolemic on examination and has no complaints of orthopnea or shortness of breath with activity.  During her  visit we discussed importance of abstaining from excess salt in the diet.  Patient denies chest pain, palpitations, dyspnea, PND, orthopnea, nausea, vomiting, dizziness, syncope, edema, weight gain, or early satiety.  Home Medications    Current Outpatient Medications  Medication Sig Dispense Refill   amLODipine (NORVASC) 5 MG tablet Take 5 mg by mouth daily.     carvedilol (COREG) 25 MG tablet Take 25 mg by mouth 2 (two) times daily.  3   No current  facility-administered medications for this visit.   Facility-Administered Medications Ordered in Other Visits  Medication Dose Route Frequency Provider Last Rate Last Admin   epoetin alfa-epbx (RETACRIT) 86578 UNIT/ML injection            epoetin alfa-epbx (RETACRIT) injection 10,000 Units  10,000 Units Subcutaneous Q28 days Claudia Desanctis, MD   10,000 Units at 06/27/22 1358     Review of Systems  Please see the history of present illness.     All other systems reviewed and are otherwise negative except as noted above.  Physical Exam    Wt Readings from Last 3 Encounters:  04/16/22 152 lb (68.9 kg)  03/26/21 152 lb 9.6 oz (69.2 kg)  03/10/20 152 lb 12.8 oz (69.3 kg)   IO:NGEXB were no vitals filed for this visit.,There is no height or weight on file to calculate BMI.  Constitutional:      Appearance: Healthy appearance. Not in distress.  Neck:     Vascular: JVD normal.  Pulmonary:     Effort: Pulmonary effort is normal.     Breath sounds: No wheezing. No rales. Diminished in the bases Cardiovascular:     Normal rate. Regular rhythm. Normal S1. Normal S2.      Murmurs: 2/6 systolic murmur Edema:    Peripheral edema absent.  Abdominal:     Palpations: Abdomen is soft non tender. There is no hepatomegaly.  Skin:    General: Skin is warm and dry.  Neurological:     General: No focal deficit present.     Mental Status: Alert and oriented to person, place and time.     Cranial Nerves: Cranial nerves are intact.  EKG/LABS/Other Studies Reviewed    ECG personally reviewed by me today -none completed today  Lab Results  Component Value Date   WBC 5.3 10/22/2015   HGB 9.2 (L) 06/27/2022   HCT 28.0 (L) 10/22/2015   MCV 87.8 10/22/2015   PLT 181 10/22/2015   Lab Results  Component Value Date   CREATININE 3.00 (H) 10/22/2015   BUN 66 (H) 10/22/2015   NA 140 10/22/2015   K 4.8 10/22/2015   CL 105 10/22/2015   CO2 23 10/22/2015   No results found for: "ALT", "AST",  "GGT", "ALKPHOS", "BILITOT" Lab Results  Component Value Date   CHOL 172 05/05/2020   HDL 53 05/05/2020   LDLCALC 106 (H) 05/05/2020   TRIG 66 05/05/2020   CHOLHDL 3.2 05/05/2020    No results found for: "HGBA1C"  Assessment & Plan    1.  Chronic systolic CHF: -2D echo most recently completed 2021 showing recovered EF of 60 to 65% with moderate LVH -Today patient is euvolemic on examination and reports that she is currently abstaining from excess salt in her diet. -Continue carvedilol 25 mg twice daily  2.  Essential hypertension: -Patient's blood pressure today was well-controlled at 132/60 -Continue amlodipine 5 mg daily and carvedilol 25 mg twice daily -Patient will continue to document blood pressures over the next  2 weeks and will resubmit to office for review.  3.  Hyperlipidemia: -Patient will need updated fasting lipid panel in 07/2022  4.  mitral valve regurgitation: -Patient has 1/6 systolic murmur and is currently euvolemic on exam with no complaints of shortness of breath with exertion.  Disposition: Follow-up with Mertie Moores, MD or APP in October 2024    Medication Adjustments/Labs and Tests Ordered: Current medicines are reviewed at length with the patient today.  Concerns regarding medicines are outlined above.   Signed, Mable Fill, Marissa Nestle, NP 06/27/2022, 3:40 PM [facility]  Note:  This document was prepared using Dragon voice recognition software and may include unintentional dictation errors.

## 2022-06-28 ENCOUNTER — Encounter: Payer: Self-pay | Admitting: Nurse Practitioner

## 2022-06-28 ENCOUNTER — Ambulatory Visit: Payer: Medicare Other | Attending: Nurse Practitioner | Admitting: Nurse Practitioner

## 2022-06-28 VITALS — BP 132/60 | HR 70 | Ht 65.0 in | Wt 151.6 lb

## 2022-06-28 DIAGNOSIS — I10 Essential (primary) hypertension: Secondary | ICD-10-CM | POA: Insufficient documentation

## 2022-06-28 DIAGNOSIS — I340 Nonrheumatic mitral (valve) insufficiency: Secondary | ICD-10-CM | POA: Diagnosis not present

## 2022-06-28 DIAGNOSIS — E785 Hyperlipidemia, unspecified: Secondary | ICD-10-CM | POA: Insufficient documentation

## 2022-06-28 DIAGNOSIS — I5020 Unspecified systolic (congestive) heart failure: Secondary | ICD-10-CM | POA: Diagnosis not present

## 2022-06-28 NOTE — Patient Instructions (Signed)
Medication Instructions:  Your physician recommends that you continue on your current medications as directed. Please refer to the Current Medication list given to you today. *If you need a refill on your cardiac medications before your next appointment, please call your pharmacy*   Lab Work: None ordered   Testing/Procedures: None ordered   Follow-Up: At [facility], you and your health needs are our priority.  As part of our continuing mission to provide you with exceptional heart care, we have created designated Provider Care Teams.  These Care Teams include your primary Cardiologist (physician) and Advanced Practice Providers (APPs -  Physician Assistants and Nurse Practitioners) who all work together to provide you with the care you need, when you need it.  We recommend signing up for the patient portal called "MyChart".  Sign up information is provided on this After Visit Summary.  MyChart is used to connect with patients for Virtual Visits (Telemedicine).  Patients are able to view lab/test results, encounter notes, upcoming appointments, etc.  Non-urgent messages can be sent to your provider as well.   To learn more about what you can do with MyChart, go to [email or web address].    Your next appointment:   7-8 month(s)  Provider:   Mertie Moores, MD     Other Instructions

## 2022-07-15 ENCOUNTER — Telehealth: Payer: Self-pay | Admitting: Cardiovascular Disease

## 2022-07-15 NOTE — Telephone Encounter (Signed)
I spoke with patient.  She was told at last office visit to check her BP daily for 2 weeks.  Prior to that she had been checking daily for 2 months.  She reports her arm is getting sore and is asking if she can decrease frequency of checks.  Patient reports the following readings over the last 2 weeks- 132/70,121/71,154/78,124/66,138/75,127/69,130/70,138/72,132/70,135/70.   I told patient she could decrease checks to 3 times per week and to let us know if readings are elevated.

## 2022-07-15 NOTE — Telephone Encounter (Signed)
Patient was calling to see if the dr wanted the patient to continue to take her bp. She states tjat she been doing it for last two weeks now. On Saturday her bp was 127/69. Please advise

## 2022-07-22 ENCOUNTER — Encounter (HOSPITAL_COMMUNITY): Payer: Medicare Other

## 2022-07-25 ENCOUNTER — Encounter (HOSPITAL_COMMUNITY): Payer: Medicare Other

## 2022-08-01 ENCOUNTER — Encounter (HOSPITAL_COMMUNITY): Payer: Medicare Other

## 2022-08-06 ENCOUNTER — Ambulatory Visit (HOSPITAL_COMMUNITY)
Admission: RE | Admit: 2022-08-06 | Discharge: 2022-08-06 | Disposition: A | Discharge status: Home / Self Care | Payer: Medicare Other | Source: Ambulatory Visit | Attending: Nephrology | Admitting: Nephrology

## 2022-08-06 VITALS — BP 121/64 | HR 64 | Temp 97.7°F | Resp 17

## 2022-08-06 DIAGNOSIS — D638 Anemia in other chronic diseases classified elsewhere: Secondary | ICD-10-CM | POA: Diagnosis not present

## 2022-08-06 LAB — IRON AND TIBC
Iron: 41 ug/dL (ref 28–170)
Saturation Ratios: 21 % (ref 10.4–31.8)
TIBC: 195 ug/dL — ABNORMAL LOW (ref 250–450)
UIBC: 154 ug/dL

## 2022-08-06 LAB — POCT HEMOGLOBIN-HEMACUE: Hemoglobin: 8.8 g/dL — ABNORMAL LOW (ref 12.0–15.0)

## 2022-08-06 LAB — FERRITIN: Ferritin: 623 ng/mL — ABNORMAL HIGH (ref 11–307)

## 2022-08-06 MED ORDER — EPOETIN ALFA-EPBX 10000 UNIT/ML IJ SOLN
INTRAMUSCULAR | Status: AC
  Filled 2022-08-06: qty 1

## 2022-08-06 MED ORDER — EPOETIN ALFA-EPBX 10000 UNIT/ML IJ SOLN
10000.0000 [IU] | INTRAMUSCULAR | Status: DC
  Administered 2022-08-06: 10000 [IU] via SUBCUTANEOUS

## 2022-08-22 ENCOUNTER — Encounter (HOSPITAL_COMMUNITY): Payer: Medicare Other

## 2022-08-29 ENCOUNTER — Encounter (HOSPITAL_COMMUNITY): Payer: Medicare Other

## 2022-09-02 ENCOUNTER — Other Ambulatory Visit (HOSPITAL_COMMUNITY): Payer: Self-pay | Admitting: *Deleted

## 2022-09-03 ENCOUNTER — Encounter (HOSPITAL_COMMUNITY): Payer: Self-pay

## 2022-09-03 ENCOUNTER — Inpatient Hospital Stay (HOSPITAL_COMMUNITY): Admission: RE | Admit: 2022-09-03 | Payer: Medicare Other | Source: Ambulatory Visit

## 2022-09-17 ENCOUNTER — Encounter (HOSPITAL_COMMUNITY): Payer: Medicare Other

## 2022-10-10 DIAGNOSIS — R7303 Prediabetes: Secondary | ICD-10-CM | POA: Diagnosis not present

## 2022-10-10 DIAGNOSIS — I509 Heart failure, unspecified: Secondary | ICD-10-CM | POA: Diagnosis not present

## 2022-10-10 DIAGNOSIS — N183 Chronic kidney disease, stage 3 unspecified: Secondary | ICD-10-CM | POA: Diagnosis not present

## 2022-10-10 DIAGNOSIS — N2581 Secondary hyperparathyroidism of renal origin: Secondary | ICD-10-CM | POA: Diagnosis not present

## 2022-10-10 DIAGNOSIS — I10 Essential (primary) hypertension: Secondary | ICD-10-CM | POA: Diagnosis not present

## 2022-10-10 DIAGNOSIS — Z0000 Encounter for general adult medical examination without abnormal findings: Secondary | ICD-10-CM | POA: Diagnosis not present

## 2022-10-10 DIAGNOSIS — D649 Anemia, unspecified: Secondary | ICD-10-CM | POA: Diagnosis not present

## 2022-10-10 DIAGNOSIS — R7309 Other abnormal glucose: Secondary | ICD-10-CM | POA: Diagnosis not present

## 2022-11-25 DIAGNOSIS — D631 Anemia in chronic kidney disease: Secondary | ICD-10-CM | POA: Diagnosis not present

## 2022-11-25 DIAGNOSIS — I5030 Unspecified diastolic (congestive) heart failure: Secondary | ICD-10-CM | POA: Diagnosis not present

## 2022-11-25 DIAGNOSIS — I129 Hypertensive chronic kidney disease with stage 1 through stage 4 chronic kidney disease, or unspecified chronic kidney disease: Secondary | ICD-10-CM | POA: Diagnosis not present

## 2022-11-25 DIAGNOSIS — R7303 Prediabetes: Secondary | ICD-10-CM | POA: Diagnosis not present

## 2022-11-25 DIAGNOSIS — N184 Chronic kidney disease, stage 4 (severe): Secondary | ICD-10-CM | POA: Diagnosis not present

## 2022-11-25 DIAGNOSIS — N1832 Chronic kidney disease, stage 3b: Secondary | ICD-10-CM | POA: Diagnosis not present

## 2022-11-25 DIAGNOSIS — N2581 Secondary hyperparathyroidism of renal origin: Secondary | ICD-10-CM | POA: Diagnosis not present

## 2022-12-12 ENCOUNTER — Inpatient Hospital Stay (HOSPITAL_COMMUNITY)
Admission: RE | Admit: 2022-12-12 | Discharge: 2022-12-12 | Disposition: A | Discharge status: Home / Self Care | Payer: Medicare Other | Source: Ambulatory Visit | Attending: Nephrology | Admitting: Nephrology

## 2022-12-12 ENCOUNTER — Encounter (HOSPITAL_COMMUNITY): Payer: Self-pay

## 2023-01-09 ENCOUNTER — Encounter (HOSPITAL_COMMUNITY): Payer: Medicare Other

## 2023-02-13 ENCOUNTER — Encounter: Payer: Self-pay | Admitting: Cardiovascular Disease

## 2023-02-13 NOTE — Progress Notes (Signed)
Cardiology Office Note   Date:  02/14/2023   ID:  Julia, [name] 01-30-1941, MRN [id number]  PCP:  Farris Has, MD  Cardiologist:   Kristeen Miss, MD   Chief Complaint  Patient presents with   Congestive Heart Failure        Problem List:  1. Essential HTN 2. Chronic systolic CHF  - EF 35% -40% 3. Chronic renal disease - stage III 4. Severe MR by TEE     Seen with granddaughter, Julia [name] is a 82 y.o. female who presents for eval of CHF and severe MR .  Recently moved from [place].   She's been treated with medicines and feels quite a bit better. She's not had any surgical repair.     No acute issues  Potassium was elevated recenly and her Aldactone , Ramipril were stopped.  Fairly active for 76 years.  Does her own shopping and housework .   Sleeps with 2 pillows.   Not particularly short of breat with 1 pillow  Had significant CHF in 2015 while in [place] ( no dyspnea, just leg edema )  She originally presented with congestive heart failure. Evaluation revealed that she had normal coronary arteries. She was found have severe mitral regurgitation due to a flail leaflet of the mitral valve. She was also found to have anemia in chronic kidney disease.   Jan. 25, 2018:  Julia [name] is seen today for follow up visit Feeling well,  Echo showed moderate MR. LV function   Oct. 5, 2018:  [name] is seen for follow up of her CHF and moderate MR .  Seen with Julia [name], Able to do all her normal activities  Walks regularly - about 45 minutes , no severe dyspnea  No syncope   November 06, 2017:   Julia [name] is seen today for follow-up visit.  She has a history of chronic diastolic congestive heart failure.  She has normal left ventricular systolic function.  There is mild prolapse of the anterior leaflet of the mitral valve.  BP is a bit elevated today .   Has been normal at home and at other doctor visits  Watches her salt .  No dyspnea,   Is not on lasix     March 08, 2019: Julia [name] is seen today for follow-up of her hypertension and chronic diastolic congestive heart failure.  She has mild mitral valve prolapse of the anterior leaflet.  BP is well controled typically .  No CP or dyspnea .   Oct. 31, 2022: Seen with daughter, Julia [name] is seen  for follow up of her HTN, chronic diastolic CHF Mild MVP of the anterior leaflet of the MV. Echocardiogram in December, 2021 reveals moderate mitral regurgitation.  Doing well breathing is ok Still walks some   Oct. 31, 2023 Pt canceled appt   Nov. 21, 2023  Seen with daughter, Julia [name] is seen for follow up of her HTN, chronic diastlic CHF, MVP   Sept. 20, 4098 Julia [name] is seen for follow up of her HTN, chronic diastolic CHF, MVP  Has started back her exercising     Past Medical History:  Diagnosis Date   CKD (chronic kidney disease)    HFrEF (heart failure with reduced ejection fraction) (HCC)    Non-ischemic cardiomyopathy / Cath in 12/15 w normal coronary arteries  // Echocardiogram 11/15: EF 25 // TEE 1/16: EF 35-40 // Echocardiogram 8/17: EF 55-60    Hypertension  Mitral regurgitation    severe by TEE in 2015 // Echo in 8/17: mod MR // Echo 12/21: EF 60-65, no RWMA, mod LVH, Gr 2 DD, normal RVSF, RVSP 26.7, severe LAE, mild MVP (medial segment of ant leaflet), mod MR, mild MS (mean gradient 5 mmHg), mod calcification of AV, mild AI, mild-mod AV sclerosis w/o stenosis (mean gradient 11 mmHg), trivial post pericardial effusion    History reviewed. No pertinent surgical history.   Current Outpatient Medications  Medication Sig Dispense Refill   amLODipine (NORVASC) 5 MG tablet Take 5 mg by mouth daily.     carvedilol (COREG) 25 MG tablet Take 25 mg by mouth 2 (two) times daily.  3   No current facility-administered medications for this visit.    Allergies:   Penicillins    Social History:  The patient  reports that she has quit smoking. She has never used  smokeless tobacco. She reports that she does not drink alcohol and does not use drugs.   Family History:  The patient's family history includes Alcohol abuse in her brother; Breast cancer in her sister; Cancer in her mother; Congestive Heart Failure in her sister; Diabetes in her sister; Diverticulitis in her brother; Gout in her sister; Healthy in her brother, brother, and daughter; Hypertension in her father and sister; Kidney disease in her sister; Lung disease in her sister; Stroke in her father.    ROS: Noted in current history, otherwise review systems is negative.    Physical Exam: Blood pressure 126/78, pulse (!) 41, height 5\' 5"  (1.651 m), weight 147 lb 3.2 oz (66.8 kg), SpO2 100%.       GEN:  Well nourished, well developed in no acute distress HEENT: Normal NECK: No JVD; No carotid bruits LYMPHATICS: No lymphadenopathy CARDIAC: RRR , no murmurs, rubs, gallops RESPIRATORY:  Clear to auscultation without rales, wheezing or rhonchi  ABDOMEN: Soft, non-tender, non-distended MUSCULOSKELETAL:  No edema; No deformity  SKIN: Warm and dry NEUROLOGIC:  Alert and oriented x 3     EKG:    Recent Labs: 08/06/2022: Hemoglobin 8.8    Lipid Panel    Component Value Date/Time   CHOL 172 05/05/2020 1137   TRIG 66 05/05/2020 1137   HDL 53 05/05/2020 1137   CHOLHDL 3.2 05/05/2020 1137   LDLCALC 106 (H) 05/05/2020 1137      Wt Readings from Last 3 Encounters:  02/14/23 147 lb 3.2 oz (66.8 kg)  06/28/22 151 lb 9.6 oz (68.8 kg)  04/16/22 152 lb (68.9 kg)      Other studies Reviewed: Additional studies/ records that were reviewed today include: . Review of the above records demonstrates:    ASSESSMENT AND PLAN:  1.  Chronic systolic congestive heart failure:   She is very stable      2. Mitral regurgitation:      has moderate MR by echo in 2021.   She is doing very well.  I don't think that she needs another echo at this point  She will let us know if she has any  dyspnea   3. CKD -   4.   HTN :  BP is well controlled.         Current medicines are reviewed at length with the patient today.  The patient does not have concerns regarding medicines.  Labs/ tests ordered today include:   No orders of the defined types were placed in this encounter.     Kristeen Miss, MD  02/14/2023 5:36  PM    [facility] [address] [place], [place], [place]  [address] Phone: [phone]; Fax: [phone]

## 2023-02-14 ENCOUNTER — Ambulatory Visit: Payer: Medicare Other | Attending: Cardiovascular Disease | Admitting: Cardiovascular Disease

## 2023-02-14 ENCOUNTER — Encounter: Payer: Self-pay | Admitting: Cardiovascular Disease

## 2023-02-14 VITALS — BP 126/78 | HR 41 | Ht 65.0 in | Wt 147.2 lb

## 2023-02-14 DIAGNOSIS — I10 Essential (primary) hypertension: Secondary | ICD-10-CM | POA: Insufficient documentation

## 2023-02-14 DIAGNOSIS — I340 Nonrheumatic mitral (valve) insufficiency: Secondary | ICD-10-CM | POA: Diagnosis not present

## 2023-02-14 NOTE — Patient Instructions (Signed)
Medication Instructions:  Your physician recommends that you continue on your current medications as directed. Please refer to the Current Medication list given to you today.  *If you need a refill on your cardiac medications before your next appointment, please call your pharmacy*   Lab Work: NONE If you have labs (blood work) drawn today and your tests are completely normal, you will receive your results only by: MyChart Message (if you have MyChart) OR A paper copy in the mail If you have any lab test that is abnormal or we need to change your treatment, we will call you to review the results.   Testing/Procedures: NONE   Follow-Up: At [facility], you and your health needs are our priority.  As part of our continuing mission to provide you with exceptional heart care, we have created designated Provider Care Teams.  These Care Teams include your primary Cardiologist (physician) and Advanced Practice Providers (APPs -  Physician Assistants and Nurse Practitioners) who all work together to provide you with the care you need, when you need it.  We recommend signing up for the patient portal called "MyChart".  Sign up information is provided on this After Visit Summary.  MyChart is used to connect with patients for Virtual Visits (Telemedicine).  Patients are able to view lab/test results, encounter notes, upcoming appointments, etc.  Non-urgent messages can be sent to your provider as well.   To learn more about what you can do with MyChart, go to [email or web address].    Your next appointment:   1 year(s)  Provider:   Kristeen Miss, MD

## 2023-03-14 DIAGNOSIS — I10 Essential (primary) hypertension: Secondary | ICD-10-CM | POA: Diagnosis not present

## 2023-03-14 DIAGNOSIS — N184 Chronic kidney disease, stage 4 (severe): Secondary | ICD-10-CM | POA: Diagnosis not present

## 2023-03-14 DIAGNOSIS — J019 Acute sinusitis, unspecified: Secondary | ICD-10-CM | POA: Diagnosis not present

## 2023-03-14 DIAGNOSIS — D649 Anemia, unspecified: Secondary | ICD-10-CM | POA: Diagnosis not present

## 2023-03-14 DIAGNOSIS — R7303 Prediabetes: Secondary | ICD-10-CM | POA: Diagnosis not present

## 2023-03-14 DIAGNOSIS — R42 Dizziness and giddiness: Secondary | ICD-10-CM | POA: Diagnosis not present

## 2023-05-05 DIAGNOSIS — I129 Hypertensive chronic kidney disease with stage 1 through stage 4 chronic kidney disease, or unspecified chronic kidney disease: Secondary | ICD-10-CM | POA: Diagnosis not present

## 2023-05-05 DIAGNOSIS — D631 Anemia in chronic kidney disease: Secondary | ICD-10-CM | POA: Diagnosis not present

## 2023-05-05 DIAGNOSIS — R7303 Prediabetes: Secondary | ICD-10-CM | POA: Diagnosis not present

## 2023-05-05 DIAGNOSIS — I5030 Unspecified diastolic (congestive) heart failure: Secondary | ICD-10-CM | POA: Diagnosis not present

## 2023-05-05 DIAGNOSIS — N184 Chronic kidney disease, stage 4 (severe): Secondary | ICD-10-CM | POA: Diagnosis not present

## 2023-05-05 DIAGNOSIS — N189 Chronic kidney disease, unspecified: Secondary | ICD-10-CM | POA: Diagnosis not present

## 2023-05-05 DIAGNOSIS — N2581 Secondary hyperparathyroidism of renal origin: Secondary | ICD-10-CM | POA: Diagnosis not present

## 2023-05-06 LAB — LAB REPORT - SCANNED
Creatinine, POC: 152.7 mg/dL
EGFR: 27

## 2023-05-16 ENCOUNTER — Encounter (HOSPITAL_COMMUNITY): Payer: Medicare Other

## 2023-05-26 ENCOUNTER — Ambulatory Visit (HOSPITAL_COMMUNITY)
Admission: RE | Admit: 2023-05-26 | Discharge: 2023-05-26 | Disposition: A | Discharge status: Home / Self Care | Payer: Medicare Other | Source: Ambulatory Visit | Attending: Nephrology | Admitting: Nephrology

## 2023-05-26 VITALS — BP 145/77 | HR 65 | Temp 97.4°F | Resp 18

## 2023-05-26 DIAGNOSIS — Z79890 Hormone replacement therapy (postmenopausal): Secondary | ICD-10-CM | POA: Diagnosis not present

## 2023-05-26 DIAGNOSIS — D631 Anemia in chronic kidney disease: Secondary | ICD-10-CM | POA: Diagnosis not present

## 2023-05-26 DIAGNOSIS — N184 Chronic kidney disease, stage 4 (severe): Secondary | ICD-10-CM | POA: Diagnosis not present

## 2023-05-26 DIAGNOSIS — D638 Anemia in other chronic diseases classified elsewhere: Secondary | ICD-10-CM | POA: Insufficient documentation

## 2023-05-26 LAB — POCT HEMOGLOBIN-HEMACUE: Hemoglobin: 9.3 g/dL — ABNORMAL LOW (ref 12.0–15.0)

## 2023-05-26 MED ORDER — EPOETIN ALFA-EPBX 10000 UNIT/ML IJ SOLN
INTRAMUSCULAR | Status: AC
  Administered 2023-05-26: 10000 [IU] via SUBCUTANEOUS
  Filled 2023-05-26: qty 1

## 2023-05-26 MED ORDER — EPOETIN ALFA-EPBX 10000 UNIT/ML IJ SOLN: 10000.0000 [IU] | INTRAMUSCULAR | Status: DC

## 2023-06-10 ENCOUNTER — Encounter (HOSPITAL_COMMUNITY): Payer: Self-pay | Admitting: Emergency Medicine

## 2023-06-10 ENCOUNTER — Observation Stay (HOSPITAL_COMMUNITY)
Admission: EM | Admit: 2023-06-10 | Discharge: 2023-06-11 | Disposition: A | Discharge status: Home / Self Care | Payer: Medicare Other | Attending: Family Medicine | Admitting: Family Medicine

## 2023-06-10 ENCOUNTER — Emergency Department (HOSPITAL_COMMUNITY): Payer: Medicare Other

## 2023-06-10 ENCOUNTER — Observation Stay (HOSPITAL_BASED_OUTPATIENT_CLINIC_OR_DEPARTMENT_OTHER): Payer: Medicare Other

## 2023-06-10 ENCOUNTER — Observation Stay (HOSPITAL_COMMUNITY): Payer: Medicare Other

## 2023-06-10 ENCOUNTER — Other Ambulatory Visit: Payer: Self-pay

## 2023-06-10 DIAGNOSIS — Z87891 Personal history of nicotine dependence: Secondary | ICD-10-CM | POA: Diagnosis not present

## 2023-06-10 DIAGNOSIS — I517 Cardiomegaly: Secondary | ICD-10-CM | POA: Diagnosis not present

## 2023-06-10 DIAGNOSIS — R0902 Hypoxemia: Secondary | ICD-10-CM | POA: Diagnosis not present

## 2023-06-10 DIAGNOSIS — D638 Anemia in other chronic diseases classified elsewhere: Secondary | ICD-10-CM | POA: Diagnosis not present

## 2023-06-10 DIAGNOSIS — I130 Hypertensive heart and chronic kidney disease with heart failure and stage 1 through stage 4 chronic kidney disease, or unspecified chronic kidney disease: Secondary | ICD-10-CM | POA: Insufficient documentation

## 2023-06-10 DIAGNOSIS — Z79899 Other long term (current) drug therapy: Secondary | ICD-10-CM | POA: Diagnosis not present

## 2023-06-10 DIAGNOSIS — I5042 Chronic combined systolic (congestive) and diastolic (congestive) heart failure: Secondary | ICD-10-CM | POA: Diagnosis not present

## 2023-06-10 DIAGNOSIS — I700 Atherosclerosis of aorta: Secondary | ICD-10-CM | POA: Diagnosis not present

## 2023-06-10 DIAGNOSIS — I5022 Chronic systolic (congestive) heart failure: Secondary | ICD-10-CM | POA: Diagnosis not present

## 2023-06-10 DIAGNOSIS — R0789 Other chest pain: Secondary | ICD-10-CM | POA: Diagnosis not present

## 2023-06-10 DIAGNOSIS — I340 Nonrheumatic mitral (valve) insufficiency: Secondary | ICD-10-CM | POA: Diagnosis not present

## 2023-06-10 DIAGNOSIS — N184 Chronic kidney disease, stage 4 (severe): Secondary | ICD-10-CM | POA: Diagnosis not present

## 2023-06-10 DIAGNOSIS — I5032 Chronic diastolic (congestive) heart failure: Secondary | ICD-10-CM | POA: Diagnosis present

## 2023-06-10 DIAGNOSIS — M549 Dorsalgia, unspecified: Secondary | ICD-10-CM | POA: Diagnosis not present

## 2023-06-10 DIAGNOSIS — R079 Chest pain, unspecified: Secondary | ICD-10-CM

## 2023-06-10 DIAGNOSIS — I4891 Unspecified atrial fibrillation: Secondary | ICD-10-CM

## 2023-06-10 DIAGNOSIS — I10 Essential (primary) hypertension: Secondary | ICD-10-CM | POA: Diagnosis not present

## 2023-06-10 DIAGNOSIS — D631 Anemia in chronic kidney disease: Secondary | ICD-10-CM | POA: Diagnosis not present

## 2023-06-10 DIAGNOSIS — I4819 Other persistent atrial fibrillation: Secondary | ICD-10-CM | POA: Diagnosis present

## 2023-06-10 LAB — BASIC METABOLIC PANEL
Anion gap: 9 (ref 5–15)
BUN: 35 mg/dL — ABNORMAL HIGH (ref 8–23)
CO2: 24 mmol/L (ref 22–32)
Calcium: 9.3 mg/dL (ref 8.9–10.3)
Chloride: 101 mmol/L (ref 98–111)
Creatinine, Ser: 1.62 mg/dL — ABNORMAL HIGH (ref 0.44–1.00)
GFR, Estimated: 31 mL/min — ABNORMAL LOW (ref >60)
Glucose, Bld: 126 mg/dL — ABNORMAL HIGH (ref 70–99)
Potassium: 4.6 mmol/L (ref 3.5–5.1)
Sodium: 134 mmol/L — ABNORMAL LOW (ref 135–145)

## 2023-06-10 LAB — CBC
HCT: 31 % — ABNORMAL LOW (ref 36.0–46.0)
Hemoglobin: 9.9 g/dL — ABNORMAL LOW (ref 12.0–15.0)
MCH: 28.9 pg (ref 26.0–34.0)
MCHC: 31.9 g/dL (ref 30.0–36.0)
MCV: 90.4 fL (ref 80.0–100.0)
Platelets: 321 10*3/uL (ref 150–400)
RBC: 3.43 MIL/uL — ABNORMAL LOW (ref 3.87–5.11)
RDW: 14.4 % (ref 11.5–15.5)
WBC: 9 10*3/uL (ref 4.0–10.5)
nRBC: 0 % (ref 0.0–0.2)

## 2023-06-10 LAB — ECHOCARDIOGRAM COMPLETE
AR max vel: 1.2 cm2
AV Area VTI: 1.27 cm2
AV Area mean vel: 1.36 cm2
AV Mean grad: 12 mm[Hg]
AV Peak grad: 24.8 mm[Hg]
Ao pk vel: 2.49 m/s
Area-P 1/2: 2.22 cm2
Height: 66 in
MV M vel: 5.73 m/s
MV Peak grad: 131.3 mm[Hg]
P 1/2 time: 583 ms
S' Lateral: 2.7 cm
Weight: 2384 [oz_av]

## 2023-06-10 LAB — IRON AND TIBC
Iron: 17 ug/dL — ABNORMAL LOW (ref 28–170)
Saturation Ratios: 8 % — ABNORMAL LOW (ref 10.4–31.8)
TIBC: 207 ug/dL — ABNORMAL LOW (ref 250–450)
UIBC: 190 ug/dL

## 2023-06-10 LAB — SEDIMENTATION RATE: Sed Rate: 94 mm/h — ABNORMAL HIGH (ref 0–22)

## 2023-06-10 LAB — VITAMIN B12: Vitamin B-12: 704 pg/mL (ref 180–914)

## 2023-06-10 LAB — HEPATIC FUNCTION PANEL
ALT: 17 U/L (ref 0–44)
AST: 19 U/L (ref 15–41)
Albumin: 3.3 g/dL — ABNORMAL LOW (ref 3.5–5.0)
Alkaline Phosphatase: 66 U/L (ref 38–126)
Bilirubin, Direct: 0.2 mg/dL (ref 0.0–0.2)
Indirect Bilirubin: 0.7 mg/dL (ref 0.3–0.9)
Total Bilirubin: 0.9 mg/dL (ref 0.0–1.2)
Total Protein: 8.6 g/dL — ABNORMAL HIGH (ref 6.5–8.1)

## 2023-06-10 LAB — TSH: TSH: 0.552 u[IU]/mL (ref 0.350–4.500)

## 2023-06-10 LAB — MAGNESIUM: Magnesium: 1.9 mg/dL (ref 1.7–2.4)

## 2023-06-10 LAB — D-DIMER, QUANTITATIVE: D-Dimer, Quant: 1.27 ug{FEU}/mL — ABNORMAL HIGH (ref 0.00–0.50)

## 2023-06-10 LAB — TROPONIN I (HIGH SENSITIVITY)
Troponin I (High Sensitivity): 7 ng/L (ref <18)
Troponin I (High Sensitivity): 8 ng/L (ref <18)

## 2023-06-10 LAB — LIPASE, BLOOD: Lipase: 26 U/L (ref 11–51)

## 2023-06-10 LAB — FERRITIN: Ferritin: 374 ng/mL — ABNORMAL HIGH (ref 11–307)

## 2023-06-10 LAB — FOLATE: Folate: 7.8 ng/mL (ref >5.9)

## 2023-06-10 LAB — C-REACTIVE PROTEIN: CRP: 12.1 mg/dL — ABNORMAL HIGH (ref <1.0)

## 2023-06-10 MED ORDER — ONDANSETRON HCL 4 MG/2ML IJ SOLN
4.0000 mg | Freq: Four times a day (QID) | INTRAMUSCULAR | Status: DC | PRN
Start: 2023-06-10 — End: 2023-06-11

## 2023-06-10 MED ORDER — HEPARIN SODIUM (PORCINE) 5000 UNIT/ML IJ SOLN
5000.0000 [IU] | Freq: Three times a day (TID) | INTRAMUSCULAR | Status: DC
  Administered 2023-06-10 – 2023-06-11 (×2): 5000 [IU] via SUBCUTANEOUS
  Filled 2023-06-10 (×2): qty 1

## 2023-06-10 MED ORDER — ONDANSETRON HCL 4 MG PO TABS: 4.0000 mg | ORAL_TABLET | Freq: Four times a day (QID) | ORAL | Status: DC | PRN

## 2023-06-10 MED ORDER — AMLODIPINE BESYLATE 5 MG PO TABS
5.0000 mg | ORAL_TABLET | Freq: Every day | ORAL | Status: DC
  Administered 2023-06-10 – 2023-06-11 (×2): 5 mg via ORAL
  Filled 2023-06-10 (×2): qty 1

## 2023-06-10 MED ORDER — NITROGLYCERIN 0.4 MG SL SUBL: 0.4000 mg | SUBLINGUAL_TABLET | SUBLINGUAL | Status: DC | PRN

## 2023-06-10 MED ORDER — IOHEXOL 350 MG/ML SOLN
75.0000 mL | Freq: Once | INTRAVENOUS | Status: AC | PRN
  Administered 2023-06-11: 75 mL via INTRAVENOUS

## 2023-06-10 MED ORDER — ALUM & MAG HYDROXIDE-SIMETH 200-200-20 MG/5ML PO SUSP
30.0000 mL | Freq: Once | ORAL | Status: AC
  Administered 2023-06-10: 30 mL via ORAL
  Filled 2023-06-10: qty 30

## 2023-06-10 MED ORDER — CARVEDILOL 12.5 MG PO TABS
25.0000 mg | ORAL_TABLET | Freq: Two times a day (BID) | ORAL | Status: DC
  Administered 2023-06-10 – 2023-06-11 (×3): 25 mg via ORAL
  Filled 2023-06-10 (×3): qty 2

## 2023-06-10 MED ORDER — ACETAMINOPHEN 325 MG PO TABS: 650.0000 mg | ORAL_TABLET | Freq: Four times a day (QID) | ORAL | Status: DC | PRN

## 2023-06-10 MED ORDER — ACETAMINOPHEN 650 MG RE SUPP: 650.0000 mg | Freq: Four times a day (QID) | RECTAL | Status: DC | PRN

## 2023-06-10 MED ORDER — PANTOPRAZOLE SODIUM 40 MG PO TBEC
40.0000 mg | DELAYED_RELEASE_TABLET | Freq: Two times a day (BID) | ORAL | Status: DC
  Administered 2023-06-10 – 2023-06-11 (×3): 40 mg via ORAL
  Filled 2023-06-10 (×4): qty 1

## 2023-06-10 NOTE — ED Notes (Signed)
 Handoff to Deanna for lunch.

## 2023-06-10 NOTE — ED Provider Notes (Signed)
  EMERGENCY DEPARTMENT AT [facility] Provider Note   CSN: [id number] Arrival date & time: 06/10/23  0845     History  Chief Complaint  Patient presents with   Chest Pain    Julia [name] is a 83 y.o. female.  Pt is a 83 yo female with pmhx significant for htn, non-ischemic cardiomyopathy, MR, CKD, and chronic anemia.  Pt said she started getting cp last night.  She does not feel pain unless she takes a deep breath.  She thought it felt like indigestion, but did not know what medication she could take for it due to her other medical problems.       Home Medications Prior to Admission medications   Medication Sig Start Date End Date Taking? Authorizing Provider  amLODipine  (NORVASC ) 5 MG tablet Take 5 mg by mouth daily.   Yes [provider]  carvedilol  (COREG ) 25 MG tablet Take 25 mg by mouth 2 (two) times daily. 10/07/15  Yes [provider]      Allergies    Penicillins    Review of Systems   Review of Systems  Cardiovascular:  Positive for chest pain.  All other systems reviewed and are negative.   Physical Exam Updated Vital Signs BP (!) 155/85   Pulse 73   Temp 98.4 F (36.9 C) (Oral)   Resp 18   Ht 5' 6 (1.676 m)   Wt 67.6 kg   SpO2 97%   BMI 24.05 kg/m  Physical Exam Vitals and nursing note reviewed.  Constitutional:      Appearance: She is well-developed.  HENT:     Head: Normocephalic and atraumatic.  Eyes:     Extraocular Movements: Extraocular movements intact.     Pupils: Pupils are equal, round, and reactive to light.  Cardiovascular:     Rate and Rhythm: Normal rate. Rhythm irregular.     Heart sounds: Normal heart sounds.  Pulmonary:     Effort: Pulmonary effort is normal.     Breath sounds: Normal breath sounds.  Abdominal:     General: Bowel sounds are normal.     Palpations: Abdomen is soft.  Musculoskeletal:        General: Normal range of motion.     Cervical back: Normal range of motion  and neck supple.  Skin:    General: Skin is warm.     Capillary Refill: Capillary refill takes less than 2 seconds.  Neurological:     General: No focal deficit present.     Mental Status: She is alert and oriented to person, place, and time.  Psychiatric:        Mood and Affect: Mood normal.        Behavior: Behavior normal.     ED Results / Procedures / Treatments   Labs (all labs ordered are listed, but only abnormal results are displayed) Labs Reviewed  BASIC METABOLIC PANEL - Abnormal; Notable for the following components:      Result Value   Sodium 134 (*)    Glucose, Bld 126 (*)    BUN 35 (*)    Creatinine, Ser 1.62 (*)    GFR, Estimated 31 (*)    All other components within normal limits  CBC - Abnormal; Notable for the following components:   RBC 3.43 (*)    Hemoglobin 9.9 (*)    HCT 31.0 (*)    All other components within normal limits  HEPATIC FUNCTION PANEL - Abnormal; Notable for  the following components:   Total Protein 8.6 (*)    Albumin 3.3 (*)    All other components within normal limits  LIPASE, BLOOD  TROPONIN I (HIGH SENSITIVITY)  TROPONIN I (HIGH SENSITIVITY)    EKG EKG Interpretation Date/Time:  Tuesday June 10 2023 10:46:09 EST Ventricular Rate:  71 PR Interval:    QRS Duration:  157 QT Interval:  454 QTC Calculation: 494 R Axis:   27  Text Interpretation: Atrial fibrillation IVCD, consider atypical RBBB No significant change since last tracing Confirmed by Dean Clarity [phone]) on 06/10/2023 11:35:45 AM  Radiology DG Chest Port 1 View Result Date: 06/10/2023 CLINICAL DATA:  Chest pain EXAM: PORTABLE CHEST 1 VIEW COMPARISON:  None available FINDINGS: Cardiomegaly. No overt edema or effusions. Linear scarring or atelectasis within the lingula. No focal opacity on the right. No acute bony abnormality. Aortic atherosclerosis. IMPRESSION: Cardiomegaly.  No active disease. Electronically Signed   By: Franky Crease M.D.   On: 06/10/2023 10:01     Procedures Procedures    Medications Ordered in ED Medications  nitroGLYCERIN  (NITROSTAT ) SL tablet 0.4 mg (has no administration in time range)  amLODipine  (NORVASC ) tablet 5 mg (has no administration in time range)  carvedilol  (COREG ) tablet 25 mg (has no administration in time range)  alum & mag hydroxide-simeth (MAALOX/MYLANTA) 200-200-20 MG/5ML suspension 30 mL (30 mLs Oral Given 06/10/23 1020)    ED Course/ Medical Decision Making/ A&P                                 Medical Decision Making Amount and/or Complexity of Data Reviewed Labs: ordered. Radiology: ordered.  Risk OTC drugs. Prescription drug management. Decision regarding hospitalization.   This patient presents to the ED for concern of cp, this involves an extensive number of treatment options, and is a complaint that carries with it a high risk of complications and morbidity.  The differential diagnosis includes cardiac, pulm, gi, msk   Co morbidities that complicate the patient evaluation  htn, non-ischemic cardiomyopathy, MR, CKD, and chronic anemia   Additional history obtained:  Additional history obtained from epic chart review  Lab Tests:  I Ordered, and personally interpreted labs.  The pertinent results include:  cbc with hgb 9.9 (stable), bmp with bun 35, cr 1.62, and gfr 31 (no recent kidney function tests)   Imaging Studies ordered:  I ordered imaging studies including cxr  I independently visualized and interpreted imaging which showed htn, non-ischemic cardiomyopathy, MR, CKD, and chronic anemia I agree with the radiologist interpretation   Cardiac Monitoring:  The patient was maintained on a cardiac monitor.  I personally viewed and interpreted the cardiac monitored which showed an underlying rhythm of: afib   Medicines ordered and prescription drug management:  I ordered medication including gi cocktail and nitro  for cp  Reevaluation of the patient after these medicines  showed that the patient improved I have reviewed the patients home medicines and have made adjustments as needed   Critical Interventions:  Cardiology consult   Consultations Obtained:  I requested consultation with the cardiologist (Dr. Debera),  and discussed lab and imaging findings as well as pertinent plan - he will see pt in consult Pt d/w Dr. Evonnie (triad) for admission.   Problem List / ED Course:  New onset afib:  unclear when sx started.  She is on coreg , so hr is controlled.  Pt was seen and examined  by cardiology.  They recommend admission for ECHO and troponin trends. CHA2DS2/VAS Stroke Risk Points  40 (age, female, htn), but cards wants to hold off on anticoagulation at this point.        CP:  nl trop.  Will continue to trend.   Reevaluation:  After the interventions noted above, I reevaluated the patient and found that they have :improved   Social Determinants of Health:  Lives at home   Dispostion:  After consideration of the diagnostic results and the patients response to treatment, I feel that the patent would benefit from admission.          Final Clinical Impression(s) / ED Diagnoses Final diagnoses:  New onset atrial fibrillation (HCC)  Chest pain, unspecified type    Rx / DC Orders ED Discharge Orders     None         Dean Clarity, MD 06/10/23 1224

## 2023-06-10 NOTE — H&P (Addendum)
 History and Physical    Patient: Julia [name] FMW:[id number] DOB: 1940/08/06 DOA: 06/10/2023 DOS: the patient was seen and examined on 06/10/2023 PCP: Kip Righter, MD  Patient coming from: Home  Chief Complaint:  Chief Complaint  Patient presents with   Chest Pain   HPI: Julia [name] is a 83 year old female with a history of HFimpEF (EF 25% in 2015), hypertension, CKD stage IV, right bundle branch block presenting with chest pain.  The patient states that she had some substernal chest pain that began about an hour after she ate a hamburger and potato salad.  She describes it as a pressure that was worse with taking a deep breath.  She denies any worsening with exertion.  She denies any shortness of breath, nausea, vomiting, dizziness, diaphoresis.  She denies any fevers or chills.  She has not had any headache, neck pain, cough, hemoptysis.  She denies any abdominal pain, dysuria, hematuria.  There is no hematochezia or melena.  When she continued to have the chest discomfort on the morning of 06/09/2022, she presented for further evaluation and treatment.  She endorses compliance with her antihypertensive medications. In the ED, the patient was afebrile hemodynamically stable with oxygen saturation 98% room air.  WBC 9.0, hemoglobin 9.9, platelets 321.  Sodium 134, potassium 4.6, bicarbonate 24, serum creatinine 1.62.  AST 19, ALT 70, alk phosphatase 66, total bilirubin 0.9.  Lipase 26.  Chest x-ray was negative for any infiltrates or edema.  EKG showed atrial fibrillation with right bundle branch block.  Troponin was 7>>8.  Cardiology was consulted to assist with management.  Review of Systems: As mentioned in the history of present illness. All other systems reviewed and are negative. Past Medical History:  Diagnosis Date   CKD (chronic kidney disease)    HFrEF (heart failure with reduced ejection fraction) (HCC)    Non-ischemic cardiomyopathy / Cath in 12/15 w normal coronary arteries   // Echocardiogram 11/15: EF 25 // TEE 1/16: EF 35-40 // Echocardiogram 8/17: EF 55-60    Hypertension    Mitral regurgitation    severe by TEE in 2015 // Echo in 8/17: mod MR // Echo 12/21: EF 60-65, no RWMA, mod LVH, Gr 2 DD, normal RVSF, RVSP 26.7, severe LAE, mild MVP (medial segment of ant leaflet), mod MR, mild MS (mean gradient 5 mmHg), mod calcification of AV, mild AI, mild-mod AV sclerosis w/o stenosis (mean gradient 11 mmHg), trivial post pericardial effusion   History reviewed. No pertinent surgical history. Social History:  reports that she has quit smoking. She has never used smokeless tobacco. She reports that she does not drink alcohol and does not use drugs.  Allergies  Allergen Reactions   Penicillins Other (See Comments)    UNSPECIFIED REACTION  Has patient had a PCN reaction causing immediate rash, facial/tongue/throat swelling, SOB or lightheadedness with hypotension: No Has patient had a PCN reaction causing severe rash involving mucus membranes or skin necrosis: No Has patient had a PCN reaction that required hospitalization No Has patient had a PCN reaction occurring within the last 10 years: No If all of the above answers are NO, then may proceed with Cephalosporin use.     Family History  Problem Relation Age of Onset   Cancer Mother    Hypertension Father    Stroke Father    Breast cancer Sister    Kidney disease Sister    Lung disease Sister    Healthy Brother    Healthy Brother  Diverticulitis Brother    Congestive Heart Failure Sister    Gout Sister    Diabetes Sister    Hypertension Sister    Healthy Daughter    Alcohol abuse Brother     Prior to Admission medications   Medication Sig Start Date End Date Taking? Authorizing Provider  amLODipine  (NORVASC ) 5 MG tablet Take 5 mg by mouth daily.   Yes [provider]  carvedilol  (COREG ) 25 MG tablet Take 25 mg by mouth 2 (two) times daily. 10/07/15  Yes [provider]     Physical Exam: Vitals:   06/10/23 1000 06/10/23 1100 06/10/23 1200 06/10/23 1227  BP: (!) 150/72 (!) 156/76 (!) 143/80   Pulse: 74 71 73   Resp: 17 20 (!) 21   Temp:    97.6 F (36.4 C)  TempSrc:      SpO2: 98% 95% 96%   Weight:      Height:       GENERAL:  A&O x 3, NAD, well developed, cooperative, follows commands HEENT: [place]/AT, No thrush, No icterus, No oral ulcers Neck:  No neck mass, No meningismus, soft, supple CV: RRR, no S3, no S4, no rub, no JVD Lungs: Fine bibasilar rales.  No wheezing.  Abdomen Abd: soft/NT +BS, nondistended Ext: No edema, no lymphangitis, no cyanosis, no rashes Neuro:  CN II-XII intact, strength 4/5 in RUE, RLE, strength 4/5 LUE, LLE; sensation intact bilateral; no dysmetria; babinski equivocal  Data Reviewed: Data reviewed above in the history Assessment and Plan: Chest pain -Somewhat atypical by history -It is reproducible with palpation of the epigastrium -Troponin 7>> 8 -Check D-dimer -Echocardiogram -Lipase 26 -Start PPI -Did not improve with Maalox in the ED -appreciate cardiology eval  Newly diagnosed atrial fibrillation -Rate controlled -New diagnosis this admission -Continue carvedilol  -CHADS-VASc = 5 -TSH  Chronic HFimpEF -03/2014 EF 25% -05/05/2020 echo EF 60 to 65%, no WMA, grade 1 DD, normal RVF -Continue carvedilol  -Clinically euvolemic  CKD stage IV -Baseline creatinine 1.6-1.8  Essential hypertension -Continue carvedilol  and amlodipine   Anemia of CKD -Baseline hemoglobin~9 -check anemia panel   Advance Care Planning: FULL  Consults: cardiology  Family Communication: none  Severity of Illness: The appropriate patient status for this patient is OBSERVATION. Observation status is judged to be reasonable and necessary in order to provide the required intensity of service to ensure the patient's safety. The patient's presenting symptoms, physical exam findings, and initial radiographic and laboratory data  in the context of their medical condition is felt to place them at decreased risk for further clinical deterioration. Furthermore, it is anticipated that the patient will be medically stable for discharge from the hospital within 2 midnights of admission.   Author: Alm Schneider, MD 06/10/2023 12:53 PM  For on call review [email or web address].

## 2023-06-10 NOTE — ED Triage Notes (Signed)
 Chest pain radiating to back starting last night. Pt reports she does not feel the pain until she breathes deep. Pt denies any recent injury. Pt has a history of a defective heart valve that she takes coreg for and is compliant.

## 2023-06-10 NOTE — Consult Note (Addendum)
 Cardiology Consultation   [name] ID: Julia [name] MRN: [id number]; DOB: 10/21/1940  Admit date: 06/10/2023 Date of Consult: 06/10/2023  PCP:  Julia Righter, MD   [facility] Providers Cardiologist:  Julia Passe, MD        [name] Profile:   Julia [name] is a 83 y.o. female with a hx of HFimpEF (EF 25% in 2015 with cath showing normal cors, EF normalized by repeat imaging), MR, HTN and Stage 3 CKD who is being seen 06/10/2023 for Julia evaluation of chest pain and atrial fibrillation at Julia request of Dr. Dean.  History of Present Illness:   Julia [name] was last examined by Dr. Passe in 01/2023 and denied any symptoms at that time. There were no plans for a follow-up echocardiogram at that time and Julia [name] was continued on Julia [name] current medical therapy of Amlodipine  5 mg daily and Coreg  25 mg twice daily.  Julia [name] presented to Julia [name] ED this morning for evaluation of chest pain which started Julia night prior to evaluation. In talking with [name] today, Julia [name] reports being in Julia [name] normal state of health until last night when Julia [name] developed chest discomfort along Julia [name] sternal region. Julia [name] reports this feels worse when Julia [name] takes a deep breath. Julia [name] feels that this might be related to what Julia [name] had for supper as Julia [name] reports consuming a large meal of hamburgers and potato salad. Julia [name] did receive Maalox while in Julia ED with no improvement. Julia pain is not worse with exertion but Julia [name] reports if Julia [name] takes a deep breath while walking, Julia [name] does have pain. No associated dyspnea, nausea, vomiting or diaphoresis. No recent orthopnea, PND or pitting edema. Reports good compliance with Julia [name] current medical therapy.  Initial labs showed WBC 9.0, Hgb 9.9 (close to baseline), platelets 321 K, Na+ 134, K+ 4.6 and creatinine 1.62 (at 1.81 in 04/2023). Hs Troponin negative at 7 with repeat pending. CXR shows cardiomegaly with no active disease. EKG shows rate-controlled atrial fibrillation, HR 77 with known  RBBB.    Past Medical History:  Diagnosis Date   CKD (chronic kidney disease)    HFrEF (heart failure with reduced ejection fraction) (HCC)    Non-ischemic cardiomyopathy / Cath in 12/15 w normal coronary arteries  // Echocardiogram 11/15: EF 25 // TEE 1/16: EF 35-40 // Echocardiogram 8/17: EF 55-60    Hypertension    Mitral regurgitation    severe by TEE in 2015 // Echo in 8/17: mod MR // Echo 12/21: EF 60-65, no RWMA, mod LVH, Gr 2 DD, normal RVSF, RVSP 26.7, severe LAE, mild MVP (medial segment of ant leaflet), mod MR, mild MS (mean gradient 5 mmHg), mod calcification of AV, mild AI, mild-mod AV sclerosis w/o stenosis (mean gradient 11 mmHg), trivial post pericardial effusion    History reviewed. No pertinent surgical history.   Home Medications:  Prior to Admission medications   Medication Sig Start Date End Date Taking? Authorizing Provider  amLODipine  (NORVASC ) 5 MG tablet Take 5 mg by mouth daily.   Yes [provider]  carvedilol  (COREG ) 25 MG tablet Take 25 mg by mouth 2 (two) times daily. 10/07/15  Yes [provider]    Inpatient Medications: Scheduled Meds:  amLODipine   5 mg Oral Daily   carvedilol   25 mg Oral BID    PRN Meds: nitroGLYCERIN   Allergies:    Allergies  Allergen Reactions   Penicillins Other (See Comments)    UNSPECIFIED REACTION  Has [name] had a PCN reaction  causing immediate rash, facial/tongue/throat swelling, SOB or lightheadedness with hypotension: No Has [name] had a PCN reaction causing severe rash involving mucus membranes or skin necrosis: No Has [name] had a PCN reaction that required hospitalization No Has [name] had a PCN reaction occurring within Julia last 10 years: No If all of Julia above answers are NO, then may proceed with Cephalosporin use.     Social History:   Social History   Socioeconomic History   Marital status: Widowed    Spouse name: Not on file   Number of children: Not on file   Years of  education: Not on file   Highest education level: Not on file  Occupational History   Not on file  Tobacco Use   Smoking status: Former   Smokeless tobacco: Never  Vaping Use   Vaping status: Never Used  Substance and Sexual Activity   Alcohol use: No   Drug use: No   Sexual activity: Not on file  Other Topics Concern   Not on file  Social History Narrative   Not on file    Family History:    Family History  Problem Relation Age of Onset   Cancer Mother    Hypertension Father    Stroke Father    Breast cancer Sister    Kidney disease Sister    Lung disease Sister    Healthy Brother    Healthy Brother    Diverticulitis Brother    Congestive Heart Failure Sister    Gout Sister    Diabetes Sister    Hypertension Sister    Healthy Daughter    Alcohol abuse Brother      ROS:  Please see Julia history of present illness. All other ROS reviewed and negative.     Physical Exam/Data:   Vitals:   06/10/23 0912 06/10/23 0915 06/10/23 0930 06/10/23 0945  BP:  (!) 155/80 (!) 151/86 (!) 155/85  Pulse:  78 75 73  Resp:  18 15 18   Temp:  98.4 F (36.9 C)    TempSrc:  Oral    SpO2:  99% 98% 97%  Weight: 67.6 kg     Height: 5' 6 (1.676 m)      No intake or output data in Julia 24 hours ending 06/10/23 1219    06/10/2023    9:12 AM 02/14/2023    2:01 PM 06/28/2022    1:51 PM  Last 3 Weights  Weight (lbs) 149 lb 147 lb 3.2 oz 151 lb 9.6 oz  Weight (kg) 67.586 kg 66.769 kg 68.765 kg     Body mass index is 24.05 kg/m.  General:  Well nourished, well developed female appearing in no acute distress HEENT: normal Neck: no JVD Vascular: No carotid bruits; Distal pulses 2+ bilaterally Cardiac:  normal S1, S2; Irregularly irregular; 2/6 systolic murmur along Apex.  Lungs:  clear to auscultation bilaterally, no wheezing, rhonchi or rales  Abd: soft, nontender, no hepatomegaly  Ext: no pitting edema Musculoskeletal:  No deformities, BUE and BLE strength normal and  equal Skin: warm and dry  Neuro:  CNs 2-12 intact, no focal abnormalities noted Psych:  Normal affect   EKG:  Julia EKG was personally reviewed and demonstrates: Rate-controlled atrial fibrillation, HR 77 with known RBBB.  Telemetry:  Telemetry was personally reviewed and demonstrates: Atrial fibrillation, HR in 70's to 80's.   Relevant CV Studies:  Echocardiogram: 04/2020 IMPRESSIONS    1. Left ventricular ejection fraction, by estimation, is 60 to 65%. Julia  left ventricle has normal function. Julia left ventricle has no regional  wall motion abnormalities. Julia left ventricular internal cavity size was  mildly dilated. There is moderate  left ventricular hypertrophy of Julia posterior segment. Left ventricular  diastolic parameters are consistent with Grade II diastolic dysfunction  (pseudonormalization). Elevated left ventricular end-diastolic pressure.   2. Right ventricular systolic function is normal. Julia right ventricular  size is normal. There is normal pulmonary artery systolic pressure. Julia  estimated right ventricular systolic pressure is 26.7 mmHg.   3. Left atrial size was severely dilated.   4. Julia mitral valve is degenerative. There is mild holosystolic prolapse  of Julia medial segment of Julia anterior leaflet of Julia mitral valve.  Moderate mitral valve regurgitation. Mild mitral stenosis. Julia mean mitral  valve gradient is 5.0 mmHg at a HR of  62bpm.   5. Julia aortic valve is tricuspid. There is moderate calcification of Julia  aortic valve. There is moderate thickening of Julia aortic valve. Aortic  valve regurgitation is mild. Mild to moderate aortic valve  sclerosis/calcification is present, without any  evidence of aortic stenosis. Aortic regurgitation PHT measures 461 msec.  Aortic valve area, by VTI measures 1.21 cm. Aortic valve mean gradient  measures 11.0 mmHg. Aortic valve Vmax measures 2.13 m/s.   6. Julia inferior vena cava is normal in size with <50% respiratory   variability, suggesting right atrial pressure of 8 mmHg.   7. Julia pericardial effusion is posterior to Julia left ventricle.   Laboratory Data:  High Sensitivity Troponin:   Recent Labs  Lab 06/10/23 0944  TROPONINIHS 7     Chemistry Recent Labs  Lab 06/10/23 0944  NA 134*  K 4.6  CL 101  CO2 24  GLUCOSE 126*  BUN 35*  CREATININE 1.62*  CALCIUM  9.3  GFRNONAA 31*  ANIONGAP 9    Recent Labs  Lab 06/10/23 0944  PROT 8.6*  ALBUMIN 3.3*  AST 19  ALT 17  ALKPHOS 66  BILITOT 0.9   Hematology Recent Labs  Lab 06/10/23 0944  WBC 9.0  RBC 3.43*  HGB 9.9*  HCT 31.0*  MCV 90.4  MCH 28.9  MCHC 31.9  RDW 14.4  PLT 321    Radiology/Studies:  DG Chest Port 1 View Result Date: 06/10/2023 CLINICAL DATA:  Chest pain EXAM: PORTABLE CHEST 1 VIEW COMPARISON:  None available FINDINGS: Cardiomegaly. No overt edema or effusions. Linear scarring or atelectasis within Julia lingula. No focal opacity on Julia right. No acute bony abnormality. Aortic atherosclerosis. IMPRESSION: Cardiomegaly.  No active disease. Electronically Signed   By: Franky Crease M.D.   On: 06/10/2023 10:01    Assessment and Plan:   1. Chest Pain with Atypical Features - Julia [name] chest pain has a pleuritic quality as it is worse with inspiration and is not necessarily associated with exertion or positional changes. Initial Hs troponin negative with repeat pending. Will also check ESR and CRP. Will obtain an echocardiogram to assess for any structural abnormalities. If this is reassuring, would not anticipate further ischemic testing this admission given Julia [name] atypical symptoms.  2. Newly Diagnosed Atrial Fibrillation - New diagnosis for Julia [name] this admission and heart rate is currently well-controlled in Julia 70's. Unclear if this is playing a role in Julia [name] symptoms as Julia [name] denies any specific palpitations and Julia [name] chest pain seems pleuritic. Continue Coreg  25 mg twice daily for rate control. - Given Julia [name] CHA2DS2-VASc  score of 5, would anticipate starting anticoagulation once echocardiogram results  are available.  3. History of HFimpEF/NICM - Julia [name] ejection fraction was previously 25% in 2015 with cardiac catheterization at that time showing normal coronary arteries.  Julia [name] EF has normalized by repeat imaging in Julia interim. - Julia [name] appears euvolemic by examination today and denies any recent respiratory issues. Repeat echo pending.  4. Mitral Regurgitation - Julia [name] did have moderate MR by echocardiogram in 04/2020. Repeat imaging pending.  5. HTN - Would continue PTA Amlodipine  5 mg daily and Coreg  25 mg twice daily at Julia time of admission.  6. Stage 3 CKD - Creatinine is at 1.62 on admission which is close to Julia [name] known baseline.   Risk Assessment/Risk Scores:    CHA2DS2-VASc Score = 5   This indicates a 7.2% annual risk of stroke. Julia [name]'s score is based upon: CHF History: 1 HTN History: 1 Diabetes History: 0 Stroke History: 0 Vascular Disease History: 0 Age Score: 2 Gender Score: 1   For questions or updates, please contact [facility] Please consult [email or web address] for contact info under    Signed, Laymon CHRISTELLA Qua, PA-C  06/10/2023 12:19 PM   Attending note:  [name] seen and examined.  I reviewed Julia [name] records and discussed Julia case with [name] PA-C, I agree with Julia [name] above findings.  Cardiac history discussed above.  Julia [name] has done well symptomatically speaking, last saw Dr. Alveta in September 2024.  Reports no recent change in stamina or exertional status.  States that yesterday evening after eating a fairly large meal, Julia [name] began to experience a feeling of tightness in Julia [name] chest, worse when Julia [name] would take a deep breath in, not specifically related to exertion.  Reports no regular sense of indigestion, no dysphagia.  Symptoms persisted and Julia [name] ultimately called EMS, transported to Julia [place] ER for further assessment.  Julia [name] did receive Maalox in Julia ER without  improvement.  Julia [name] is noted to be in rate controlled atrial fibrillation of uncertain duration, but newly documented.  No reported sense of palpitations.  Julia [name] states that Julia [name] has been taking Julia [name] medications regularly.  No recent fevers or chills, no cough or hemoptysis, no known sick contacts.  On examination Julia [name] appears comfortable.  Afebrile, heart rate in Julia 70s in atrial fibrillation by telemetry.  Systolic blood pressure 140s to 150s.  Julia [name] is not hypoxic.  Lungs are clear.  Cardiac exam reveals irregularly irregular rhythm with 3/6 systolic murmur, no obvious gallop or rub.  No peripheral edema.  Pertinent lab work includes potassium 4.6, BUN 35, creatinine 1.62, normal AST and ALT, high-sensitivity troponin I levels of 7 and 8, WBC 9.0, hemoglobin 9.9 which is relatively stable, platelets 321.  ECG shows coarse atrial fibrillation with right bundle branch block, 71 bpm.  Chest x-ray reports cardiomegaly without other acute process.  [name] presents with atypical chest discomfort as described above, no clear evidence of ACS by initial high-sensitivity troponin I levels.  Symptoms occurred after a large meal, did not improve with Maalox in Julia ER.  Julia [name] does have newly documented rate controlled atrial fibrillation, uncertain duration or whether this is necessarily contributing to any chest discomfort at rest which would be unusual.  Symptoms possibly inflammatory etiology based on description.  Given history of nonischemic cardiomyopathy and significant mitral regurgitation in Julia past (improved by last imaging in 2021), a follow-up echocardiogram will be obtained.  Will check ESR and CRP, Julia [name] does not have a pericardial rub.  CHA2DS2-VASc score is 5, plan on initiation of DOAC pending results  of above workup.  Otherwise continue Julia [name] current medical regimen including Coreg  and Norvasc .  Jayson JUDITHANN Sierras, M.D., F.A.C.C.

## 2023-06-10 NOTE — Hospital Course (Signed)
 83 year old female with a history of HFimpEF (EF 25% in 2015), hypertension, CKD stage IV, right bundle branch block presenting with chest pain.  The patient states that she had some substernal chest pain that began about an hour after she ate a hamburger and potato salad.  She describes it as a pressure that was worse with taking a deep breath.  She denies any worsening with exertion.  She denies any shortness of breath, nausea, vomiting, dizziness, diaphoresis.  She denies any fevers or chills.  She has not had any headache, neck pain, cough, hemoptysis.  She denies any abdominal pain, dysuria, hematuria.  There is no hematochezia or melena.  When she continued to have the chest discomfort on the morning of 06/09/2022, she presented for further evaluation and treatment.  She endorses compliance with her antihypertensive medications. In the ED, the patient was afebrile hemodynamically stable with oxygen saturation 98% room air.  WBC 9.0, hemoglobin 9.9, platelets 321.  Sodium 134, potassium 4.6, bicarbonate 24, serum creatinine 1.62.  AST 19, ALT 70, alk phosphatase 66, total bilirubin 0.9.  Lipase 26.  Chest x-ray was negative for any infiltrates or edema.  EKG showed atrial fibrillation with right bundle branch block.  Troponin was 7>>8.  Cardiology was consulted to assist with management.

## 2023-06-10 NOTE — ED Notes (Signed)
 ED TO INPATIENT HANDOFF REPORT  ED Nurse Name and Phone #: [phone]  S Name/Age/Gender Julia [name] 83 y.o. female Room/Bed: APA06/APA06  Code Status   Code Status: Full Code  Home/SNF/Other Home Patient oriented to: self, place, time, and situation Is this baseline? Yes   Triage Complete: Triage complete  Chief Complaint New onset atrial fibrillation [facility]) [I48.91]  Triage Note Chest pain radiating to back starting last night. Pt reports she does not feel the pain until she breathes deep. Pt denies any recent injury. Pt has a history of a defective heart valve that she takes coreg  for and is compliant.    Allergies Allergies  Allergen Reactions   Penicillins Other (See Comments)    UNSPECIFIED REACTION  Has patient had a PCN reaction causing immediate rash, facial/tongue/throat swelling, SOB or lightheadedness with hypotension: No Has patient had a PCN reaction causing severe rash involving mucus membranes or skin necrosis: No Has patient had a PCN reaction that required hospitalization No Has patient had a PCN reaction occurring within the last 10 years: No If all of the above answers are NO, then may proceed with Cephalosporin use.     Level of Care/Admitting Diagnosis ED Disposition     ED Disposition  Admit   Condition  --   Comment  [name] Area: [facility] [[id number]]  Level of Care: Telemetry [5]  Covid Evaluation: Asymptomatic - no recent exposure (last 10 days) testing not required  Diagnosis: New onset atrial fibrillation [facility]) [[id number]]  Admitting Physician: TAT, DAVID [email or web address]  Attending Physician: TAT, DAVID [[id number]]          B Medical/Surgery History Past Medical History:  Diagnosis Date   CKD (chronic kidney disease)    HFrEF (heart failure with reduced ejection fraction) (HCC)    Non-ischemic cardiomyopathy / Cath in 12/15 w normal coronary arteries  // Echocardiogram 11/15: EF 25 // TEE 1/16: EF 35-40 // Echocardiogram 8/17: EF  55-60    Hypertension    Mitral regurgitation    severe by TEE in 2015 // Echo in 8/17: mod MR // Echo 12/21: EF 60-65, no RWMA, mod LVH, Gr 2 DD, normal RVSF, RVSP 26.7, severe LAE, mild MVP (medial segment of ant leaflet), mod MR, mild MS (mean gradient 5 mmHg), mod calcification of AV, mild AI, mild-mod AV sclerosis w/o stenosis (mean gradient 11 mmHg), trivial post pericardial effusion   History reviewed. No pertinent surgical history.   A IV Location/Drains/Wounds Patient Lines/Drains/Airways Status     Active Line/Drains/Airways     Name Placement date Placement time Site Days   Peripheral IV 06/10/23 20 G Right Wrist 06/10/23  0915  Wrist  less than 1            Intake/Output Last 24 hours No intake or output data in the 24 hours ending 06/10/23 1456  Labs/Imaging Results for orders placed or performed during the [name] encounter of 06/10/23 (from the past 48 hours)  Basic metabolic panel     Status: Abnormal   Collection Time: 06/10/23  9:44 AM  Result Value Ref Range   Sodium 134 (L) 135 - 145 mmol/L   Potassium 4.6 3.5 - 5.1 mmol/L   Chloride 101 98 - 111 mmol/L   CO2 24 22 - 32 mmol/L   Glucose, Bld 126 (H) 70 - 99 mg/dL    Comment: Glucose reference range applies only to samples taken after fasting for at least 8 hours.   BUN 35 (H) 8 -  23 mg/dL   Creatinine, Ser 8.37 (H) 0.44 - 1.00 mg/dL   Calcium  9.3 8.9 - 10.3 mg/dL   GFR, Estimated 31 (L) >60 mL/min    Comment: (NOTE) Calculated using the CKD-EPI Creatinine Equation (2021)    Anion gap 9 5 - 15    Comment: Performed at [name], [address] [place], [place] [address]  CBC     Status: Abnormal   Collection Time: 06/10/23  9:44 AM  Result Value Ref Range   WBC 9.0 4.0 - 10.5 K/uL   RBC 3.43 (L) 3.87 - 5.11 MIL/uL   Hemoglobin 9.9 (L) 12.0 - 15.0 g/dL   HCT 68.9 (L) 63.9 - 53.9 %   MCV 90.4 80.0 - 100.0 fL   MCH 28.9 26.0 - 34.0 pg   MCHC 31.9 30.0 - 36.0 g/dL   RDW 85.5 88.4 - 84.4 %    Platelets 321 150 - 400 K/uL   nRBC 0.0 0.0 - 0.2 %    Comment: Performed at [name], [address] [place], [place] [address]  Troponin I (High Sensitivity)     Status: None   Collection Time: 06/10/23  9:44 AM  Result Value Ref Range   Troponin I (High Sensitivity) 7 <18 ng/L    Comment: (NOTE) Elevated high sensitivity troponin I (hsTnI) values and significant  changes across serial measurements may suggest ACS but many other  chronic and acute conditions are known to elevate hsTnI results.  Refer to the Links section for chest pain algorithms and additional  guidance. Performed at [name], [address] [place], [place] [address]   Hepatic function panel     Status: Abnormal   Collection Time: 06/10/23  9:44 AM  Result Value Ref Range   Total Protein 8.6 (H) 6.5 - 8.1 g/dL   Albumin 3.3 (L) 3.5 - 5.0 g/dL   AST 19 15 - 41 U/L   ALT 17 0 - 44 U/L   Alkaline Phosphatase 66 38 - 126 U/L   Total Bilirubin 0.9 0.0 - 1.2 mg/dL   Bilirubin, Direct 0.2 0.0 - 0.2 mg/dL   Indirect Bilirubin 0.7 0.3 - 0.9 mg/dL    Comment: Performed at [facility], [address] [place], [place] [address]  Lipase, blood     Status: None   Collection Time: 06/10/23  9:44 AM  Result Value Ref Range   Lipase 26 11 - 51 U/L    Comment: Performed at [name], [address] [place], [place] [address]  Troponin I (High Sensitivity)     Status: None   Collection Time: 06/10/23 11:34 AM  Result Value Ref Range   Troponin I (High Sensitivity) 8 <18 ng/L    Comment: (NOTE) Elevated high sensitivity troponin I (hsTnI) values and significant  changes across serial measurements may suggest ACS but many other  chronic and acute conditions are known to elevate hsTnI results.  Refer to the Links section for chest pain algorithms and additional  guidance. Performed at [facility], [address], [place], [place] [address]    ECHOCARDIOGRAM COMPLETE Result Date: 06/10/2023     ECHOCARDIOGRAM REPORT   Patient Name:   Julia [name] Date of Exam: 06/10/2023 Medical Rec #:  [id number]      Height:       66.0 in Accession #:    [id number]     Weight:       149.0 lb Date of Birth:  1940/06/05  BSA:          1.765 m Patient Age:    82 years       BP:           155/85 mmHg Patient Gender: F              HR:           73 bpm. Exam Location:  [facility] Procedure: 2D Echo, Cardiac Doppler and Color Doppler Indications:    Chest Pain R07.9 / Atrial Fibrillation l48.91  History:        Patient has prior history of Echocardiogram examinations, most                 recent 05/05/2020. CHF, Arrythmias:Atrial Fibrillation; Risk                 Factors:Hypertension. CKD (chronic kidney disease) stage 4.  Sonographer:    Aida Pizza RCS Referring Phys: [id number] LAYMON CHRISTELLA QUA IMPRESSIONS  1. Left ventricular ejection fraction, by estimation, is 60 to 65%. The left ventricle has normal function. The left ventricle has no regional wall motion abnormalities. There is mild concentric left ventricular hypertrophy. Left ventricular diastolic parameters are indeterminate.  2. Right ventricular systolic function is normal. The right ventricular size is normal. There is moderately elevated pulmonary artery systolic pressure. The estimated right ventricular systolic pressure is 58.7 mmHg.  3. Left atrial size was severely dilated.  4. Right atrial size was moderately dilated.  5. Moderate pericardial effusion. The pericardial effusion is posterior to the left ventricle. There is no evidence of cardiac tamponade.  6. Posterior mitral leaflet is restricted with resulting anterior leaflet prolapse. The mitral valve is abnormal. Moderate mitral valve regurgitation.  7. The aortic valve is tricuspid. There is moderate calcification of the aortic valve. Aortic valve regurgitation is mild. Mild aortic valve stenosis. Aortic regurgitation PHT measures 583 msec. Aortic valve mean gradient measures 12.0 mmHg. Aortic  valve Vmax measures 2.49 m/s.  8. The inferior vena cava is dilated in size with >50% respiratory variability, suggesting right atrial pressure of 8 mmHg. Comparison(s): Prior images reviewed side by side. LVEF normal range at 60-65%. Moderate, posteriorly directed mitral regurgitation. Mild aortic stenosis. Moderately elevated estimated RVSP. Moderate posterior pericardial effusion without tamponade (present but small on prior study 2021). FINDINGS  Left Ventricle: Left ventricular ejection fraction, by estimation, is 60 to 65%. The left ventricle has normal function. The left ventricle has no regional wall motion abnormalities. The left ventricular internal cavity size was normal in size. There is  mild concentric left ventricular hypertrophy. Left ventricular diastolic parameters are indeterminate. Right Ventricle: The right ventricular size is normal. No increase in right ventricular wall thickness. Right ventricular systolic function is normal. There is moderately elevated pulmonary artery systolic pressure. The tricuspid regurgitant velocity is 3.56 m/s, and with an assumed right atrial pressure of 8 mmHg, the estimated right ventricular systolic pressure is 58.7 mmHg. Left Atrium: Left atrial size was severely dilated. Right Atrium: Right atrial size was moderately dilated. Pericardium: A moderately sized pericardial effusion is present. The pericardial effusion is posterior to the left ventricle. There is no evidence of cardiac tamponade. Mitral Valve: Posterior mitral leaflet is restricted with resulting anterior leaflet prolapse. The mitral valve is abnormal. There is mild thickening of the mitral valve leaflet(s). There is mild calcification of the mitral valve leaflet(s). Moderate mitral valve regurgitation, with posteriorly-directed jet. Tricuspid Valve: The tricuspid valve is grossly normal. Tricuspid valve regurgitation  is mild. Aortic Valve: The aortic valve is tricuspid. There is moderate  calcification of the aortic valve. There is mild aortic valve annular calcification. Aortic valve regurgitation is mild. Aortic regurgitation PHT measures 583 msec. Mild aortic stenosis is present. Aortic valve mean gradient measures 12.0 mmHg. Aortic valve peak gradient measures 24.8 mmHg. Aortic valve area, by VTI measures 1.27 cm. Pulmonic Valve: The pulmonic valve was grossly normal. Pulmonic valve regurgitation is trivial. Aorta: The aortic root is normal in size and structure. Venous: The inferior vena cava is dilated in size with greater than 50% respiratory variability, suggesting right atrial pressure of 8 mmHg. IAS/Shunts: No atrial level shunt detected by color flow Doppler.  LEFT VENTRICLE PLAX 2D LVIDd:         5.40 cm LVIDs:         2.70 cm LV PW:         1.30 cm LV IVS:        1.00 cm LVOT diam:     1.70 cm LV SV:         63 LV SV Index:   36 LVOT Area:     2.27 cm  RIGHT VENTRICLE RV S prime:     10.40 cm/s TAPSE (M-mode): 1.5 cm LEFT ATRIUM              Index         RIGHT ATRIUM           Index LA diam:        6.70 cm  3.80 cm/m    RA Area:     25.90 cm LA Vol (A2C):   116.0 ml 65.74 ml/m   RA Volume:   79.60 ml  45.11 ml/m LA Vol (A4C):   202.0 ml 114.47 ml/m LA Biplane Vol: 154.0 ml 87.27 ml/m  AORTIC VALVE AV Area (Vmax):    1.20 cm AV Area (Vmean):   1.36 cm AV Area (VTI):     1.27 cm AV Vmax:           249.00 cm/s AV Vmean:          158.000 cm/s AV VTI:            0.499 m AV Peak Grad:      24.8 mmHg AV Mean Grad:      12.0 mmHg LVOT Vmax:         132.00 cm/s LVOT Vmean:        94.500 cm/s LVOT VTI:          0.279 m LVOT/AV VTI ratio: 0.56 AI PHT:            583 msec  AORTA Ao Root diam: 2.80 cm MITRAL VALVE                TRICUSPID VALVE MV Area (PHT): 2.22 cm     TR Peak grad:   50.7 mmHg MV Decel Time: 342 msec     TR Vmax:        356.00 cm/s MR Peak grad: 131.3 mmHg MR Mean grad: 86.0 mmHg     SHUNTS MR Vmax:      573.00 cm/s   Systemic VTI:  0.28 m MR Vmean:     438.0 cm/s     Systemic Diam: 1.70 cm MV E velocity: 200.00 cm/s Jayson Sierras MD Electronically signed by Jayson Sierras MD Signature Date/Time: 06/10/2023/1:41:22 PM    Final    DG Chest Port 1 View Result Date:  06/10/2023 CLINICAL DATA:  Chest pain EXAM: PORTABLE CHEST 1 VIEW COMPARISON:  None available FINDINGS: Cardiomegaly. No overt edema or effusions. Linear scarring or atelectasis within the lingula. No focal opacity on the right. No acute bony abnormality. Aortic atherosclerosis. IMPRESSION: Cardiomegaly.  No active disease. Electronically Signed   By: Franky Crease M.D.   On: 06/10/2023 10:01    Pending Labs Unresulted Labs (From admission, onward)     Start     Ordered   06/10/23 1401  C-reactive protein  Once,   R        06/10/23 1400   06/10/23 1401  Sedimentation rate  Once,   R        06/10/23 1400   06/10/23 1307  Vitamin B12  Once,   R        06/10/23 1306   06/10/23 1307  Folate  Once,   R        06/10/23 1306   06/10/23 1307  TSH  Once,   R        06/10/23 1306   06/10/23 1307  Iron  and TIBC  Once,   R        06/10/23 1306   06/10/23 1307  Ferritin  Once,   R        06/10/23 1306   06/10/23 1306  Magnesium  Once,   R        06/10/23 1305   06/10/23 1306  D-dimer, quantitative  Once,   R        06/10/23 1305   Signed and Held  Basic metabolic panel  Tomorrow morning,   R        Signed and Held   Signed and Held  CBC  Tomorrow morning,   R        Signed and Held            Vitals/Pain Today's Vitals   06/10/23 1400 06/10/23 1415 06/10/23 1430 06/10/23 1445  BP: 139/85 (!) 147/77 132/80 137/69  Pulse: 71 65 68 63  Resp: (!) 22 19 (!) 22 (!) 23  Temp:      TempSrc:      SpO2: 97% 96% 96% 95%  Weight:      Height:      PainSc:        Isolation Precautions No active isolations  Medications Medications  nitroGLYCERIN  (NITROSTAT ) SL tablet 0.4 mg (has no administration in time range)  amLODipine  (NORVASC ) tablet 5 mg (5 mg Oral Given 06/10/23 1234)  carvedilol   (COREG ) tablet 25 mg (25 mg Oral Given 06/10/23 1234)  pantoprazole  (PROTONIX ) EC tablet 40 mg (40 mg Oral Given 06/10/23 1325)  alum & mag hydroxide-simeth (MAALOX/MYLANTA) 200-200-20 MG/5ML suspension 30 mL (30 mLs Oral Given 06/10/23 1020)    Mobility walks     Focused Assessments    R Recommendations: See Admitting Provider Note  Report given to:   Additional Notes:

## 2023-06-10 NOTE — Progress Notes (Signed)
*  PRELIMINARY RESULTS* Echocardiogram 2D Echocardiogram has been performed.  Julia [name] 06/10/2023, 1:27 PM

## 2023-06-11 DIAGNOSIS — I3139 Other pericardial effusion (noninflammatory): Secondary | ICD-10-CM

## 2023-06-11 DIAGNOSIS — D638 Anemia in other chronic diseases classified elsewhere: Secondary | ICD-10-CM | POA: Diagnosis not present

## 2023-06-11 DIAGNOSIS — I4891 Unspecified atrial fibrillation: Secondary | ICD-10-CM | POA: Diagnosis not present

## 2023-06-11 DIAGNOSIS — N184 Chronic kidney disease, stage 4 (severe): Secondary | ICD-10-CM | POA: Diagnosis not present

## 2023-06-11 DIAGNOSIS — I2510 Atherosclerotic heart disease of native coronary artery without angina pectoris: Secondary | ICD-10-CM | POA: Diagnosis not present

## 2023-06-11 DIAGNOSIS — I4819 Other persistent atrial fibrillation: Secondary | ICD-10-CM | POA: Diagnosis not present

## 2023-06-11 DIAGNOSIS — J9811 Atelectasis: Secondary | ICD-10-CM | POA: Diagnosis not present

## 2023-06-11 DIAGNOSIS — R079 Chest pain, unspecified: Secondary | ICD-10-CM | POA: Diagnosis not present

## 2023-06-11 LAB — CBC
HCT: 27.8 % — ABNORMAL LOW (ref 36.0–46.0)
Hemoglobin: 8.6 g/dL — ABNORMAL LOW (ref 12.0–15.0)
MCH: 27.6 pg (ref 26.0–34.0)
MCHC: 30.9 g/dL (ref 30.0–36.0)
MCV: 89.1 fL (ref 80.0–100.0)
Platelets: 284 10*3/uL (ref 150–400)
RBC: 3.12 MIL/uL — ABNORMAL LOW (ref 3.87–5.11)
RDW: 14.5 % (ref 11.5–15.5)
WBC: 7.8 10*3/uL (ref 4.0–10.5)
nRBC: 0 % (ref 0.0–0.2)

## 2023-06-11 LAB — BASIC METABOLIC PANEL
Anion gap: 8 (ref 5–15)
BUN: 33 mg/dL — ABNORMAL HIGH (ref 8–23)
CO2: 25 mmol/L (ref 22–32)
Calcium: 8.9 mg/dL (ref 8.9–10.3)
Chloride: 100 mmol/L (ref 98–111)
Creatinine, Ser: 1.78 mg/dL — ABNORMAL HIGH (ref 0.44–1.00)
GFR, Estimated: 28 mL/min — ABNORMAL LOW (ref >60)
Glucose, Bld: 86 mg/dL (ref 70–99)
Potassium: 4.4 mmol/L (ref 3.5–5.1)
Sodium: 133 mmol/L — ABNORMAL LOW (ref 135–145)

## 2023-06-11 MED ORDER — COLCHICINE 0.6 MG PO TABS: 0.3000 mg | ORAL_TABLET | Freq: Every day | ORAL | 0 refills | Status: DC

## 2023-06-11 MED ORDER — IRON (FERROUS SULFATE) 325 (65 FE) MG PO TABS: 1.0000 | ORAL_TABLET | Freq: Every day | ORAL | 1 refills | Status: AC

## 2023-06-11 MED ORDER — COLCHICINE 0.3 MG HALF TABLET
0.3000 mg | ORAL_TABLET | Freq: Every day | ORAL | Status: DC
  Administered 2023-06-11: 0.3 mg via ORAL
  Filled 2023-06-11 (×3): qty 1

## 2023-06-11 NOTE — Progress Notes (Signed)
Patient states understanding of discharge instrucitons

## 2023-06-11 NOTE — Discharge Instructions (Signed)
 Pericarditis is an inflammation of the pericardium, the thin sac-like membrane surrounding the heart. It can cause chest pain and other symptoms, and it is important to understand its causes, symptoms, and treatment options. Causes Pericarditis can be caused by various factors, including:  Viral infections (most common in developed countries)  Bacterial infections (e.g., tuberculosis in endemic areas)  Post-cardiac injury (after heart surgery or a heart attack)  Systemic inflammatory diseases (e.g., rheumatoid arthritis)  Idiopathic (unknown cause) in many cases[1-3] Symptoms Common symptoms of pericarditis include:  Sharp, stabbing chest pain that may worsen when lying down and improve when sitting up or leaning forward  Fever  Pericardial friction rub (a scratchy or grating sound heard with a stethoscope)  Shortness of breath, especially when reclining[1-3] Diagnosis Diagnosis of pericarditis typically involves:  Clinical evaluation of symptoms  Electrocardiogram (ECG) showing characteristic changes (e.g., ST-segment elevation, PR-segment depression)  Echocardiography to assess pericardial effusion (fluid around the heart)  Blood tests to check for inflammation (e.g., elevated C-reactive protein)[1-3] Treatment The treatment of pericarditis depends on the underlying cause and severity:  Nonsteroidal anti-inflammatory drugs (NSAIDs): First-line treatment to reduce inflammation and pain. High-dose NSAIDs are typically used and tapered over several weeks as symptoms improve and inflammation markers normalize.[1-3]  Colchicine : Used in combination with NSAIDs to reduce symptoms and prevent recurrence. A typical course lasts for 3 months for acute pericarditis and at least 6 months for recurrent cases.[1-3]  Corticosteroids: Reserved for patients who do not respond to NSAIDs and colchicine  or have contraindications. They are also used in specific conditions like systemic inflammatory  diseases.[1-3]  Hospitalization: Required for patients with high-risk features such as large pericardial effusion, cardiac tamponade, or failure to respond to initial treatment.[1-3] Prevention and Follow-Up Preventive measures include:  Adhering to prescribed treatment regimens  Regular follow-up with healthcare providers to monitor for recurrence  Avoiding strenuous activities until fully recovered[1-3] When to Seek Medical Attention Seek immediate medical attention if you experience:  Severe or worsening chest pain  Difficulty breathing  Signs of cardiac tamponade (e.g., low blood pressure, fainting, rapid heartbeat)[1-3] Conclusion Pericarditis is a manageable condition with appropriate treatment. NSAIDs and colchicine  are the mainstays of therapy, and most patients have a favorable prognosis. Regular follow-up and adherence to treatment are crucial to prevent recurrence and complications   IMPORTANT INFORMATION: PAY CLOSE ATTENTION   PHYSICIAN DISCHARGE INSTRUCTIONS  Follow with Primary care provider  Ronna Coho, MD  and other consultants as instructed by your Hospitalist Physician  SEEK MEDICAL CARE OR RETURN TO EMERGENCY ROOM IF SYMPTOMS COME BACK, WORSEN OR NEW PROBLEM DEVELOPS   Please note: You were cared for by a hospitalist during your hospital stay. Every effort will be made to forward records to your primary care provider.  You can request that your primary care provider send for your hospital records if they have not received them.  Once you are discharged, your primary care physician will handle any further medical issues. Please note that NO REFILLS for any discharge medications will be authorized once you are discharged, as it is imperative that you return to your primary care physician (or establish a relationship with a primary care physician if you do not have one) for your post hospital discharge needs so that they can reassess your need for medications and monitor  your lab values.  Please get a complete blood count and chemistry panel checked by your Primary MD at your next visit, and again as instructed by your Primary MD.  Get  Medicines reviewed and adjusted: Please take all your medications with you for your next visit with your Primary MD  Laboratory/radiological data: Please request your Primary MD to go over all hospital tests and procedure/radiological results at the follow up, please ask your primary care provider to get all Hospital records sent to his/her office.  In some cases, they will be blood work, cultures and biopsy results pending at the time of your discharge. Please request that your primary care provider follow up on these results.  If you are diabetic, please bring your blood sugar readings with you to your follow up appointment with primary care.    Please call and make your follow up appointments as soon as possible.    Also Note the following: If you experience worsening of your admission symptoms, develop shortness of breath, life threatening emergency, suicidal or homicidal thoughts you must seek medical attention immediately by calling 911 or calling your MD immediately  if symptoms less severe.  You must read complete instructions/literature along with all the possible adverse reactions/side effects for all the Medicines you take and that have been prescribed to you. Take any new Medicines after you have completely understood and accpet all the possible adverse reactions/side effects.   Do not drive when taking Pain medications or sleeping medications (Benzodiazepines)  Do not take more than prescribed Pain, Sleep and Anxiety Medications. It is not advisable to combine anxiety,sleep and pain medications without talking with your primary care practitioner  Special Instructions: If you have smoked or chewed Tobacco  in the last 2 yrs please stop smoking, stop any regular Alcohol  and or any Recreational drug use.  Wear  Seat belts while driving.  Do not drive if taking any narcotic, mind altering or controlled substances or recreational drugs or alcohol.

## 2023-06-11 NOTE — Progress Notes (Signed)
   06/11/23 1212  TOC Brief Assessment  Insurance and Status Reviewed  Patient has primary care physician Yes  Home environment has been reviewed Home  Prior level of function: Indendent  Prior/Current Home Services No current home services  Social Drivers of Health Review SDOH reviewed no interventions necessary  Readmission risk has been reviewed Yes  Transition of care needs no transition of care needs at this time    Transition of Care Department [facility]) has reviewed patient and no TOC needs have been identified at this time. We will continue to monitor patient advancement through interdisciplinary progression rounds. If new patient transition needs arise, please place a TOC consult.

## 2023-06-11 NOTE — Progress Notes (Addendum)
 Patient Name: Julia [name] Date of Encounter: 06/11/2023 [facility] Cardiologist: Ahmad Alert, MD   Interval Summary  .    Reports her chest pain has improved but still occurring with inspiration. No association with exertion or positional changes. Supine overnight with no significant discomfort. Breathing at baseline. Also discussed with the patient's granddaughter by phone.  Vital Signs .    Vitals:   06/10/23 1633 06/10/23 2029 06/11/23 0409 06/11/23 0855  BP: 132/69 132/65 115/65 107/63  Pulse: 64 66 66   Resp: (!) 21  (!) 21   Temp: 97.8 F (36.6 C) 99.5 F (37.5 C) 98.2 F (36.8 C)   TempSrc: Oral Oral Oral   SpO2: 100% 94% 96% 95%  Weight:      Height:        Intake/Output Summary (Last 24 hours) at 06/11/2023 1036 Last data filed at 06/11/2023 0901 Gross per 24 hour  Intake 240 ml  Output --  Net 240 ml      06/10/2023    9:12 AM 02/14/2023    2:01 PM 06/28/2022    1:51 PM  Last 3 Weights  Weight (lbs) 149 lb 147 lb 3.2 oz 151 lb 9.6 oz  Weight (kg) 67.586 kg 66.769 kg 68.765 kg      Telemetry/ECG/Cardiac Studies    Telemetry: Atrial fibrillation, HR in 60's to 70's.  - Personally Reviewed   Echocardiogram: 05/2023 IMPRESSIONS    1. Left ventricular ejection fraction, by estimation, is 60 to 65%. The  left ventricle has normal function. The left ventricle has no regional  wall motion abnormalities. There is mild concentric left ventricular  hypertrophy. Left ventricular diastolic  parameters are indeterminate.   2. Right ventricular systolic function is normal. The right ventricular  size is normal. There is moderately elevated pulmonary artery systolic  pressure. The estimated right ventricular systolic pressure is 58.7 mmHg.   3. Left atrial size was severely dilated.   4. Right atrial size was moderately dilated.   5. Moderate pericardial effusion. The pericardial effusion is posterior  to the left ventricle. There is no evidence of  cardiac tamponade.   6. Posterior mitral leaflet is restricted with resulting anterior leaflet  prolapse. The mitral valve is abnormal. Moderate mitral valve  regurgitation.   7. The aortic valve is tricuspid. There is moderate calcification of the  aortic valve. Aortic valve regurgitation is mild. Mild aortic valve  stenosis. Aortic regurgitation PHT measures 583 msec. Aortic valve mean  gradient measures 12.0 mmHg. Aortic  valve Vmax measures 2.49 m/s.   8. The inferior vena cava is dilated in size with >50% respiratory  variability, suggesting right atrial pressure of 8 mmHg.   Comparison(s): Prior images reviewed side by side. LVEF normal range at  60-65%. Moderate, posteriorly directed mitral regurgitation. Mild aortic  stenosis. Moderately elevated estimated RVSP. Moderate posterior  pericardial effusion without tamponade  (present but small on prior study 2021).     Physical Exam .    GEN: Pleasant female appearing in no acute distress.   Neck: No JVD Cardiac: Irregularly irregular, 2/6 systolic murmur throughout.  Respiratory: Clear to auscultation bilaterally. GI: Soft, nontender, non-distended  MS: No pitting edema  Assessment & Plan .     1. Pleuritic Chest Pain/Pericardial Effusion - Presented with chest pain which was worse with inspiration and not associated with exertion or positional changes. CTA showed no evidence of a PE. Hs Troponin values have been negative. Inflammatory markers are elevated  with CRP at 12.1 and ESR at 94. Echo shows a moderate pericardial effusion but no evidence of tamponade and she was noted to have a small effusion by prior imaging in 2021. - Presentation not fully consistent with pericarditis but complicated by history of prior pericardial effusion but this has increased in size. By review of Clinical Key, dosing for Colchicine  when older than 85 yo and weighing less than 70 kg would be 0.25 or 0.3mg  daily for 3 months. Will start Colchicine   0.3mg  daily for now. Would typically use NSAIDS but not ideal given her Stage 3 CKD and high-dose ASA also not ideal given her anemia. Can consider steroids if refractory pain but thankfully this is starting to improve. She will need a follow-up limited echo in 4-6 weeks for reassessment and would also recheck ESR and CRP. Will arrange for follow-up in [place].     2. Newly Diagnosed Atrial Fibrillation - New diagnosis for the patient this admission and heart rate is currently well-controlled in the 70's. Remains on Coreg  25mg  BID.  - Her CHA2DS2-VASc score is 5. Reviewed with Dr. Londa Rival and given possible pericarditis and her effusion, will hold on adding anticoagulation at this time. Reconsider at outpatient follow-up pending repeat labs and echo.     3. History of HFimpEF/NICM - EF was previously reduced at 25% in 2015 with cardiac catheterization at that time showing normal coronary arteries and EF had normalized by repeat imaging. EF remains preserved at 60-65% by repeat imaging this admission.    4. Mitral Regurgitation - She did have moderate MR by echocardiogram in 04/2020 and moderate by repeat echo this admission. Continue to follow as an outpatient.    5. HTN - BP has overall been well-controlled, at 107/63 on most recent check. Remains on PTA Amlodipine  5 mg daily and Coreg  25 mg twice daily.   6. Stage 3 CKD - Creatinine was at 1.62 on admission, at 1.78 today which is close to her known baseline. Avoid nephrotoxic agents.    7. Anemia - B12 and Folate WNL. Iron  low at 17 but Ferritin at 374. Management per the admitting team.     For questions or updates, please contact [facility] Please consult [email or web address] for contact info under     Signed, Dorma Gash, PA-C    Attending note:  Patient seen and examined.  I discussed the case with [name] PA-C, I agree with her above findings.  Interval chart reviewed since consultation yesterday.  Patient's  inflammatory markers were significantly increased including ESR 94 and CRP 12.1.  She has a moderate-sized pericardial effusion posteriorly by echocardiogram, normal LVEF at 60 to 65% and no regional wall motion abnormalities.  Interestingly, she small pericardial effusion by her last study in 2021, so not entirely clear that this is necessarily acute.  Chest CTA negative for pulmonary embolus.  Not clear-cut diagnosis of pericarditis, but still certainly possible without other explanation for symptoms.  She is asymptomatic regarding her atrial fibrillation which remains rate controlled at baseline.  She is afebrile today.  Heart rate in the 60s to 70s in atrial fibrillation by telemetry.  Blood pressure 107/63.  Lungs are clear.  Cardiac exam with irregular irregular rhythm, no rub or gallop.  Pertinent lab work includes potassium 4.4, BUN 33, creatinine 1.78, hemoglobin 8.6, iron  17, MCV 89.  Anticipate discharge home today, she feels better and would like to go home.  Will arrange follow-up with Dr. Alroy Aspen or APP in [place]  as before.  Plan to initiate low-dose colchicine  for now, holding off on NSAIDs/aspirin given renal insufficiency and significant anemia.  If her symptoms do not completely improve could consider a course of steroids as an outpatient, but will hold off for now.  Suggest repeat CRP and ESR at office follow-up in [place] and a repeat limited echocardiogram.  Her CHA2DS2-VASc score is 5, so would recommend anticoagulation presuming pericardial effusion has not progressed and her anemia is otherwise stable and not requiring follow-up.  Gerard Knight, M.D., F.A.C.C.

## 2023-06-11 NOTE — Discharge Summary (Signed)
 Physician Discharge Summary  Julia [name] UEA:[id number] DOB: 07-06-1940 DOA: 06/10/2023  PCP: Ronna Coho, MD Cardiology: Nahser  Admit date: 06/10/2023 Discharge date: 06/11/2023  Admitted From:  Home  Disposition: Home   Recommendations for Outpatient Follow-up:  Follow up with PCP in 1 weeks Follow up with Marlyse Single on 07/02/23 as scheduled  Please obtain BMP/CBC in 1-2 weeks  Discharge Condition: STABLE   CODE STATUS: FULL DIET: resume previous home diet    Brief Hospitalization Summary: Please see all hospital notes, images, labs for full details of the hospitalization. Admission provider HPI:  82 year old female with a history of HFimpEF (EF 25% in 2015), hypertension, CKD stage IV, right bundle branch block presenting with chest pain.  The patient states that she had some substernal chest pain that began about an hour after she ate a hamburger and potato salad.  She describes it as a pressure that was worse with taking a deep breath.  She denies any worsening with exertion.  She denies any shortness of breath, nausea, vomiting, dizziness, diaphoresis.  She denies any fevers or chills.  She has not had any headache, neck pain, cough, hemoptysis.  She denies any abdominal pain, dysuria, hematuria.  There is no hematochezia or melena.  When she continued to have the chest discomfort on the morning of 06/09/2022, she presented for further evaluation and treatment.  She endorses compliance with her antihypertensive medications. In the ED, the patient was afebrile hemodynamically stable with oxygen saturation 98% room air.  WBC 9.0, hemoglobin 9.9, platelets 321.  Sodium 134, potassium 4.6, bicarbonate 24, serum creatinine 1.62.  AST 19, ALT 70, alk phosphatase 66, total bilirubin 0.9.  Lipase 26.  Chest x-ray was negative for any infiltrates or edema.  EKG showed atrial fibrillation with right bundle branch block.  Troponin was 7>>8.  Cardiology was consulted to assist with  management.  Hospital Course by problem list   Chest pain presumably due to pericarditis Working diagnosis of pericarditis  -Somewhat atypical by history -It is reproducible with palpation of the epigastrium -Troponin 7>> 8 and ACS ruled out  -elevated D-dimer - PE ruled out  -Echocardiogram with findings of moderate posterior pericardial effusion without tamponade  -Lipase 26 -Did not improve with Maalox in the ED or PPI -appreciate cardiology eval and they have started her on colchicine  0.3 mg daily and arranged for outpatient follow up with Donalynn Fry on 07/02/23 for repeat echo and further mgmt.  Discussed colchicine  use with pharm D, reported very low risk of adverse side effects with this low dose of colchicine  ordered.  See consultation notes from cardiology.    Newly diagnosed atrial fibrillation -Rate controlled -New diagnosis this admission -Continue carvedilol  25 mg BID -CHADS-VASc = 5 -TSH   Chronic HFimpEF -03/2014 EF 25% -05/05/2020 echo EF 60 to 65%, no WMA, grade 1 DD, normal RVF -Continue carvedilol  -Clinically euvolemic   CKD stage IV -Baseline creatinine 1.6-1.8   Essential hypertension -Continue carvedilol  and amlodipine    Anemia of CKD -Baseline hemoglobin~9 -iron  17, TIBC 207, ferritin 378 -added daily oral iron  supplement   Discharge Diagnoses:  Principal Problem:   New onset atrial fibrillation (HCC) Active Problems:   Chronic systolic CHF (congestive heart failure) (HCC)   Anemia of chronic disease   CKD (chronic kidney disease) stage 4, GFR 15-29 ml/min [facility])  Discharge Instructions:  Allergies as of 06/11/2023       Reactions   Penicillins Other (See Comments)   UNSPECIFIED REACTION  Has patient  had a PCN reaction causing immediate rash, facial/tongue/throat swelling, SOB or lightheadedness with hypotension: No Has patient had a PCN reaction causing severe rash involving mucus membranes or skin necrosis: No Has patient had a PCN reaction  that required hospitalization No Has patient had a PCN reaction occurring within the last 10 years: No If all of the above answers are "NO", then may proceed with Cephalosporin use.        Medication List     TAKE these medications    amLODipine  5 MG tablet Commonly known as: NORVASC  Take 5 mg by mouth daily.   carvedilol  25 MG tablet Commonly known as: COREG  Take 25 mg by mouth 2 (two) times daily.   colchicine  0.6 MG tablet Take 0.5 tablets (0.3 mg total) by mouth daily.   Iron  (Ferrous Sulfate ) 325 (65 Fe) MG Tabs Take 1 tablet by mouth daily with breakfast.        Follow-up Information     Marlyse Single T, PA-C Follow up on 07/02/2023.   Specialties: Cardiology, Physician Assistant Why: Cardiology Hospital Follow-up on 07/02/2023 at 1:55 PM with Marlyse Single, PA (works with Dr. Alroy Aspen) Contact information: [address] [place] [address] [phone]         Ronna Coho, MD. Schedule an appointment as soon as possible for a visit in 1 week(s).   Specialty: Family Medicine Why: Hospital Follow Up Contact information: [address] [place] [address] [phone]                Allergies  Allergen Reactions   Penicillins Other (See Comments)    UNSPECIFIED REACTION  Has patient had a PCN reaction causing immediate rash, facial/tongue/throat swelling, SOB or lightheadedness with hypotension: No Has patient had a PCN reaction causing severe rash involving mucus membranes or skin necrosis: No Has patient had a PCN reaction that required hospitalization No Has patient had a PCN reaction occurring within the last 10 years: No If all of the above answers are "NO", then may proceed with Cephalosporin use.    Allergies as of 06/11/2023       Reactions   Penicillins Other (See Comments)   UNSPECIFIED REACTION  Has patient had a PCN reaction causing immediate rash, facial/tongue/throat swelling, SOB or  lightheadedness with hypotension: No Has patient had a PCN reaction causing severe rash involving mucus membranes or skin necrosis: No Has patient had a PCN reaction that required hospitalization No Has patient had a PCN reaction occurring within the last 10 years: No If all of the above answers are "NO", then may proceed with Cephalosporin use.        Medication List     TAKE these medications    amLODipine  5 MG tablet Commonly known as: NORVASC  Take 5 mg by mouth daily.   carvedilol  25 MG tablet Commonly known as: COREG  Take 25 mg by mouth 2 (two) times daily.   colchicine  0.6 MG tablet Take 0.5 tablets (0.3 mg total) by mouth daily.   Iron  (Ferrous Sulfate ) 325 (65 Fe) MG Tabs Take 1 tablet by mouth daily with breakfast.        Procedures/Studies: CT Angio Chest Pulmonary Embolism (PE) W or WO Contrast Result Date: 06/11/2023 CLINICAL DATA:  Chest pain. EXAM: CT ANGIOGRAPHY CHEST WITH CONTRAST TECHNIQUE: Multidetector CT imaging of the chest was performed using the standard protocol during bolus administration of intravenous contrast. Multiplanar CT image reconstructions and MIPs were obtained to evaluate the vascular  anatomy. RADIATION DOSE REDUCTION: This exam was performed according to the departmental dose-optimization program which includes automated exposure control, adjustment of the mA and/or kV according to patient size and/or use of iterative reconstruction technique. CONTRAST:  75mL OMNIPAQUE  IOHEXOL  350 MG/ML SOLN COMPARISON:  None Available. FINDINGS: Cardiovascular: There is mild to moderate severity calcification of the aortic arch, without evidence of aortic aneurysm. Satisfactory opacification of the pulmonary arteries to the segmental level. No evidence of pulmonary embolism. There is moderate severity cardiomegaly with mild coronary artery calcification. A small pericardial effusion is seen. Mediastinum/Nodes: No enlarged mediastinal, hilar, or axillary lymph  nodes. Thyroid  gland, trachea, and esophagus demonstrate no significant findings. Lungs/Pleura: Mild linear atelectasis is seen within the bilateral lung bases. There is no evidence of an acute infiltrate, pleural effusion or pneumothorax. Upper Abdomen: No acute abnormality. Musculoskeletal: No chest wall abnormality. No acute or significant osseous findings. Review of the MIP images confirms the above findings. IMPRESSION: 1. No evidence of pulmonary embolism or other acute intrathoracic process. 2. Moderate severity cardiomegaly with a small pericardial effusion. 3. Mild bibasilar linear atelectasis. 4. Aortic atherosclerosis. Aortic Atherosclerosis (ICD10-I70.0). Electronically Signed   By: Virgle Grime M.D.   On: 06/11/2023 00:59   ECHOCARDIOGRAM COMPLETE Result Date: 06/10/2023    ECHOCARDIOGRAM REPORT   Patient Name:   Julia [name] [facility] Date of Exam: 06/10/2023 Medical Rec #:  [id number]      Height:       66.0 in Accession #:    [id number]     Weight:       149.0 lb Date of Birth:  14-Sep-1940      BSA:          1.765 m Patient Age:    82 years       BP:           155/85 mmHg Patient Gender: F              HR:           73 bpm. Exam Location:  [facility] Procedure: 2D Echo, Cardiac Doppler and Color Doppler Indications:    Chest Pain R07.9 / Atrial Fibrillation l48.91  History:        Patient has prior history of Echocardiogram examinations, most                 recent 05/05/2020. CHF, Arrythmias:Atrial Fibrillation; Risk                 Factors:Hypertension. CKD (chronic kidney disease) stage 4.  Sonographer:    Denese Finn RCS Referring Phys: [id number] Dorma Gash IMPRESSIONS  1. Left ventricular ejection fraction, by estimation, is 60 to 65%. The left ventricle has normal function. The left ventricle has no regional wall motion abnormalities. There is mild concentric left ventricular hypertrophy. Left ventricular diastolic parameters are indeterminate.  2. Right ventricular systolic function is  normal. The right ventricular size is normal. There is moderately elevated pulmonary artery systolic pressure. The estimated right ventricular systolic pressure is 58.7 mmHg.  3. Left atrial size was severely dilated.  4. Right atrial size was moderately dilated.  5. Moderate pericardial effusion. The pericardial effusion is posterior to the left ventricle. There is no evidence of cardiac tamponade.  6. Posterior mitral leaflet is restricted with resulting anterior leaflet prolapse. The mitral valve is abnormal. Moderate mitral valve regurgitation.  7. The aortic valve is tricuspid. There is moderate calcification of the aortic valve. Aortic valve regurgitation is mild.  Mild aortic valve stenosis. Aortic regurgitation PHT measures 583 msec. Aortic valve mean gradient measures 12.0 mmHg. Aortic valve Vmax measures 2.49 m/s.  8. The inferior vena cava is dilated in size with >50% respiratory variability, suggesting right atrial pressure of 8 mmHg. Comparison(s): Prior images reviewed side by side. LVEF normal range at 60-65%. Moderate, posteriorly directed mitral regurgitation. Mild aortic stenosis. Moderately elevated estimated RVSP. Moderate posterior pericardial effusion without tamponade (present but small on prior study 2021). FINDINGS  Left Ventricle: Left ventricular ejection fraction, by estimation, is 60 to 65%. The left ventricle has normal function. The left ventricle has no regional wall motion abnormalities. The left ventricular internal cavity size was normal in size. There is  mild concentric left ventricular hypertrophy. Left ventricular diastolic parameters are indeterminate. Right Ventricle: The right ventricular size is normal. No increase in right ventricular wall thickness. Right ventricular systolic function is normal. There is moderately elevated pulmonary artery systolic pressure. The tricuspid regurgitant velocity is 3.56 m/s, and with an assumed right atrial pressure of 8 mmHg, the estimated  right ventricular systolic pressure is 58.7 mmHg. Left Atrium: Left atrial size was severely dilated. Right Atrium: Right atrial size was moderately dilated. Pericardium: A moderately sized pericardial effusion is present. The pericardial effusion is posterior to the left ventricle. There is no evidence of cardiac tamponade. Mitral Valve: Posterior mitral leaflet is restricted with resulting anterior leaflet prolapse. The mitral valve is abnormal. There is mild thickening of the mitral valve leaflet(s). There is mild calcification of the mitral valve leaflet(s). Moderate mitral valve regurgitation, with posteriorly-directed jet. Tricuspid Valve: The tricuspid valve is grossly normal. Tricuspid valve regurgitation is mild. Aortic Valve: The aortic valve is tricuspid. There is moderate calcification of the aortic valve. There is mild aortic valve annular calcification. Aortic valve regurgitation is mild. Aortic regurgitation PHT measures 583 msec. Mild aortic stenosis is present. Aortic valve mean gradient measures 12.0 mmHg. Aortic valve peak gradient measures 24.8 mmHg. Aortic valve area, by VTI measures 1.27 cm. Pulmonic Valve: The pulmonic valve was grossly normal. Pulmonic valve regurgitation is trivial. Aorta: The aortic root is normal in size and structure. Venous: The inferior vena cava is dilated in size with greater than 50% respiratory variability, suggesting right atrial pressure of 8 mmHg. IAS/Shunts: No atrial level shunt detected by color flow Doppler.  LEFT VENTRICLE PLAX 2D LVIDd:         5.40 cm LVIDs:         2.70 cm LV PW:         1.30 cm LV IVS:        1.00 cm LVOT diam:     1.70 cm LV SV:         63 LV SV Index:   36 LVOT Area:     2.27 cm  RIGHT VENTRICLE RV S prime:     10.40 cm/s TAPSE (M-mode): 1.5 cm LEFT ATRIUM              Index         RIGHT ATRIUM           Index LA diam:        6.70 cm  3.80 cm/m    RA Area:     25.90 cm LA Vol (A2C):   116.0 ml 65.74 ml/m   RA Volume:   79.60 ml   45.11 ml/m LA Vol (A4C):   202.0 ml 114.47 ml/m LA Biplane Vol: 154.0 ml 87.27 ml/m  AORTIC  VALVE AV Area (Vmax):    1.20 cm AV Area (Vmean):   1.36 cm AV Area (VTI):     1.27 cm AV Vmax:           249.00 cm/s AV Vmean:          158.000 cm/s AV VTI:            0.499 m AV Peak Grad:      24.8 mmHg AV Mean Grad:      12.0 mmHg LVOT Vmax:         132.00 cm/s LVOT Vmean:        94.500 cm/s LVOT VTI:          0.279 m LVOT/AV VTI ratio: 0.56 AI PHT:            583 msec  AORTA Ao Root diam: 2.80 cm MITRAL VALVE                TRICUSPID VALVE MV Area (PHT): 2.22 cm     TR Peak grad:   50.7 mmHg MV Decel Time: 342 msec     TR Vmax:        356.00 cm/s MR Peak grad: 131.3 mmHg MR Mean grad: 86.0 mmHg     SHUNTS MR Vmax:      573.00 cm/s   Systemic VTI:  0.28 m MR Vmean:     438.0 cm/s    Systemic Diam: 1.70 cm MV E velocity: 200.00 cm/s Teddie Favre MD Electronically signed by Teddie Favre MD Signature Date/Time: 06/10/2023/1:41:22 PM    Final    DG Chest Port 1 View Result Date: 06/10/2023 CLINICAL DATA:  Chest pain EXAM: PORTABLE CHEST 1 VIEW COMPARISON:  None available FINDINGS: Cardiomegaly. No overt edema or effusions. Linear scarring or atelectasis within the lingula. No focal opacity on the right. No acute bony abnormality. Aortic atherosclerosis. IMPRESSION: Cardiomegaly.  No active disease. Electronically Signed   By: Janeece Mechanic M.D.   On: 06/10/2023 10:01     Subjective: Pt still having some symptoms of chest discomfort but overall much improved.   Discharge Exam: Vitals:   06/11/23 0409 06/11/23 0855  BP: 115/65 107/63  Pulse: 66   Resp: (!) 21   Temp: 98.2 F (36.8 C)   SpO2: 96% 95%   Vitals:   06/10/23 1633 06/10/23 2029 06/11/23 0409 06/11/23 0855  BP: 132/69 132/65 115/65 107/63  Pulse: 64 66 66   Resp: (!) 21  (!) 21   Temp: 97.8 F (36.6 C) 99.5 F (37.5 C) 98.2 F (36.8 C)   TempSrc: Oral Oral Oral   SpO2: 100% 94% 96% 95%  Weight:      Height:       General: Pt  is alert, awake, not in acute distress Cardiovascular: RRR, S1/S2 +, no rubs, no gallops Respiratory: CTA bilaterally, no wheezing, no rhonchi Abdominal: Soft, NT, ND, bowel sounds + Extremities: no edema, no cyanosis   The results of significant diagnostics from this hospitalization (including imaging, microbiology, ancillary and laboratory) are listed below for reference.     Microbiology: No results found for this or any previous visit (from the past 240 hours).   Labs: BNP (last 3 results) No results for input(s): "BNP" in the last 8760 hours. Basic Metabolic Panel: Recent Labs  Lab 06/10/23 0944 06/10/23 1425 06/11/23 0418  NA 134*  --  133*  K 4.6  --  4.4  CL 101  --  100  CO2 24  --  25  GLUCOSE 126*  --  86  BUN 35*  --  33*  CREATININE 1.62*  --  1.78*  CALCIUM  9.3  --  8.9  MG  --  1.9  --    Liver Function Tests: Recent Labs  Lab 06/10/23 0944  AST 19  ALT 17  ALKPHOS 66  BILITOT 0.9  PROT 8.6*  ALBUMIN 3.3*   Recent Labs  Lab 06/10/23 0944  LIPASE 26   No results for input(s): "AMMONIA" in the last 168 hours. CBC: Recent Labs  Lab 06/10/23 0944 06/11/23 0418  WBC 9.0 7.8  HGB 9.9* 8.6*  HCT 31.0* 27.8*  MCV 90.4 89.1  PLT 321 284   Cardiac Enzymes: No results for input(s): "CKTOTAL", "CKMB", "CKMBINDEX", "TROPONINI" in the last 168 hours. BNP: Invalid input(s): "POCBNP" CBG: No results for input(s): "GLUCAP" in the last 168 hours. D-Dimer Recent Labs    06/10/23 1425  DDIMER 1.27*   Hgb A1c No results for input(s): "HGBA1C" in the last 72 hours. Lipid Profile No results for input(s): "CHOL", "HDL", "LDLCALC", "TRIG", "CHOLHDL", "LDLDIRECT" in the last 72 hours. Thyroid  function studies Recent Labs    06/10/23 1425  TSH 0.552   Anemia work up Recent Labs    06/10/23 1425  VITAMINB12 704  FOLATE 7.8  FERRITIN 374*  TIBC 207*  IRON  17*   Urinalysis No results found for: "COLORURINE", "APPEARANCEUR", "LABSPEC",  "PHURINE", "GLUCOSEU", "HGBUR", "BILIRUBINUR", "KETONESUR", "PROTEINUR", "UROBILINOGEN", "NITRITE", "LEUKOCYTESUR" Sepsis Labs Recent Labs  Lab 06/10/23 0944 06/11/23 0418  WBC 9.0 7.8   Microbiology No results found for this or any previous visit (from the past 240 hours).  Time coordinating discharge: 38 mins  SIGNED:  Faustino Hook, MD  [facility] 06/11/2023, 11:46 AM How to contact the [name] Attending or Consulting provider 7A - 7P or covering provider during after hours 7P -7A, for this patient?  Check the care team in [facility] and look for a) attending/consulting [facility] provider listed and b) the [facility] team listed Log into [email or web address] and use 's universal password to access. If you do not have the password, please contact the hospital operator. Locate the [facility] provider you are looking for under [facility] and page to a number that you can be directly reached. If you still have difficulty reaching the provider, please page the [facility] (Director on Call) for the Hospitalists listed on amion for assistance.

## 2023-06-11 NOTE — Care Management Obs Status (Signed)
 MEDICARE OBSERVATION STATUS NOTIFICATION   Patient Details  Name: Julia [name] MRN: [id number] Date of Birth: August 02, 1940   Medicare Observation Status Notification Given:  Yes Leonie Rams., CMA, verbally reviewed observation notice wit [place] Gave. Copy provided.)    Shantay Sonn L Jamaar Howes 06/11/2023, 12:30 PM

## 2023-06-11 NOTE — Plan of Care (Signed)

## 2023-06-23 ENCOUNTER — Ambulatory Visit (HOSPITAL_COMMUNITY)
Admission: RE | Admit: 2023-06-23 | Discharge: 2023-06-23 | Disposition: A | Discharge status: Home / Self Care | Payer: Medicare Other | Source: Ambulatory Visit | Attending: Nephrology | Admitting: Nephrology

## 2023-06-23 VITALS — BP 131/75 | HR 62 | Temp 96.7°F

## 2023-06-23 DIAGNOSIS — D638 Anemia in other chronic diseases classified elsewhere: Secondary | ICD-10-CM | POA: Insufficient documentation

## 2023-06-23 DIAGNOSIS — N184 Chronic kidney disease, stage 4 (severe): Secondary | ICD-10-CM | POA: Diagnosis not present

## 2023-06-23 DIAGNOSIS — D631 Anemia in chronic kidney disease: Secondary | ICD-10-CM | POA: Insufficient documentation

## 2023-06-23 LAB — IRON AND TIBC
Iron: 38 ug/dL (ref 28–170)
Saturation Ratios: 16 % (ref 10.4–31.8)
TIBC: 242 ug/dL — ABNORMAL LOW (ref 250–450)
UIBC: 204 ug/dL

## 2023-06-23 LAB — POCT HEMOGLOBIN-HEMACUE: Hemoglobin: 9.9 g/dL — ABNORMAL LOW (ref 12.0–15.0)

## 2023-06-23 LAB — FERRITIN: Ferritin: 532 ng/mL — ABNORMAL HIGH (ref 11–307)

## 2023-06-23 MED ORDER — EPOETIN ALFA-EPBX 10000 UNIT/ML IJ SOLN
10000.0000 [IU] | INTRAMUSCULAR | Status: DC
  Administered 2023-06-23: 10000 [IU] via SUBCUTANEOUS

## 2023-06-23 MED ORDER — EPOETIN ALFA-EPBX 10000 UNIT/ML IJ SOLN
INTRAMUSCULAR | Status: AC
  Filled 2023-06-23: qty 1

## 2023-06-24 ENCOUNTER — Telehealth: Payer: Self-pay | Admitting: Cardiovascular Disease

## 2023-06-24 DIAGNOSIS — I10 Essential (primary) hypertension: Secondary | ICD-10-CM | POA: Diagnosis not present

## 2023-06-24 DIAGNOSIS — I4891 Unspecified atrial fibrillation: Secondary | ICD-10-CM | POA: Diagnosis not present

## 2023-06-24 DIAGNOSIS — D649 Anemia, unspecified: Secondary | ICD-10-CM | POA: Diagnosis not present

## 2023-06-24 DIAGNOSIS — I509 Heart failure, unspecified: Secondary | ICD-10-CM | POA: Diagnosis not present

## 2023-06-24 DIAGNOSIS — Z09 Encounter for follow-up examination after completed treatment for conditions other than malignant neoplasm: Secondary | ICD-10-CM | POA: Diagnosis not present

## 2023-06-24 DIAGNOSIS — N184 Chronic kidney disease, stage 4 (severe): Secondary | ICD-10-CM | POA: Diagnosis not present

## 2023-06-24 DIAGNOSIS — R7303 Prediabetes: Secondary | ICD-10-CM | POA: Diagnosis not present

## 2023-06-24 NOTE — Telephone Encounter (Signed)
Dr Kateri Plummer is requesting to speak with Lorin Picket or anyone avail to discuss EKG. [phone]

## 2023-06-24 NOTE — Telephone Encounter (Signed)
Calling to discuss patient EKG results and changes that need to be made. Please advise

## 2023-06-26 ENCOUNTER — Telehealth: Payer: Self-pay | Admitting: Cardiovascular Disease

## 2023-06-26 NOTE — Telephone Encounter (Signed)
Patient calling the office for samples of medication:   1.  What medication and dosage are you requesting samples for? Eliquis 2.5 prescribed by PCP   2.  Are you currently out of this medication? PCP asked if pt's granddaughter could ask cardiologist for samples until they are able to get this at a lower price. Concerned pt is at risk for blood clot. Please advise.

## 2023-06-27 MED ORDER — APIXABAN 2.5 MG PO TABS: 2.5000 mg | ORAL_TABLET | Freq: Two times a day (BID) | ORAL | 0 refills | Status: DC

## 2023-06-27 NOTE — Telephone Encounter (Signed)
Received: 3 days ago Nahser, Julia Ping, MD sent to Lars Mage, RN Caller: Unspecified (3 days ago,  2:31 PM) Pt was found to have new dx of atrial fib Based on creatinine of 1.6 and 83 yo, she needs Eliquis 2.5 m BID Discussed with Dr. Kateri Plummer at [facility] She will follow up with Tereso Newcomer , [place] in several weeks PN  Called and spoke with granddaughter to let her know that I dont have samples, but do have a 30-day free voucher and no activation required. Rx printed and faxed to [facility] containing the coupon billing information. She will call [facility] and have them put back original rx and fill ours, billed to coupon.

## 2023-06-27 NOTE — Telephone Encounter (Signed)
Completed in separate encounter

## 2023-07-02 ENCOUNTER — Encounter: Payer: Self-pay | Admitting: Physician Assistant

## 2023-07-02 ENCOUNTER — Ambulatory Visit: Payer: Medicare Other | Admitting: Physician Assistant

## 2023-07-02 VITALS — BP 130/80 | HR 65 | Ht 65.0 in | Wt 149.6 lb

## 2023-07-02 DIAGNOSIS — I3139 Other pericardial effusion (noninflammatory): Secondary | ICD-10-CM | POA: Diagnosis not present

## 2023-07-02 DIAGNOSIS — N184 Chronic kidney disease, stage 4 (severe): Secondary | ICD-10-CM | POA: Insufficient documentation

## 2023-07-02 DIAGNOSIS — I340 Nonrheumatic mitral (valve) insufficiency: Secondary | ICD-10-CM | POA: Diagnosis not present

## 2023-07-02 DIAGNOSIS — D638 Anemia in other chronic diseases classified elsewhere: Secondary | ICD-10-CM | POA: Diagnosis not present

## 2023-07-02 DIAGNOSIS — I4819 Other persistent atrial fibrillation: Secondary | ICD-10-CM | POA: Insufficient documentation

## 2023-07-02 DIAGNOSIS — I10 Essential (primary) hypertension: Secondary | ICD-10-CM | POA: Diagnosis not present

## 2023-07-02 DIAGNOSIS — I5032 Chronic diastolic (congestive) heart failure: Secondary | ICD-10-CM | POA: Insufficient documentation

## 2023-07-02 MED ORDER — APIXABAN 2.5 MG PO TABS: 2.5000 mg | ORAL_TABLET | Freq: Two times a day (BID) | ORAL | 0 refills | Status: AC

## 2023-07-02 NOTE — Progress Notes (Signed)
 Cardiology Office Note:    Date:  07/02/2023  ID:  Julia [name], DOB 1941/02/18, MRN [id number] PCP: Kip Righter, MD  [facility] Providers Cardiologist:  Aleene Passe, MD       Patient Profile:      HFimpEF (heart failure with improved ejection fraction)  Non-ischemic cardiomyopathy / Cath in 12/15 w normal coronary arteries  Echocardiogram 11/15: EF 25 TEE 1/16: EF 35-40 TTE 12/28/15: Mild conc LVH, EF 55-60, no RWMA, Gr 1 DD, mildly increased transvalvular AV velocity (peak 217 m/s), mild MVP, mod MR, severe LAE  TTE 05/05/20: EF 60-65, no RWMA, moderate LVH, GRII DD, normal RVSF, normal PASP, RVSP 26.7, severe LAE, MVP, moderate MR, mild MS mean gradient 5, mild AI, mild-moderate AV sclerosis, RAP 8 TTE 06/10/23: EF 60-65, no RWMA, mild LVH, normal RVSF, moderate pulmonary hypertension, RVSP 58.7, severe LAE, moderate RAE, moderate pericardial effusion, no tamponade, moderate MR, mild AI, mild AS (mean 12, V-max 249 cm/s, DI 0.56), RAP 8 Persistent atrial fibrillation  Pericardial Effusion Chronic kidney disease stage IV Hypertension  Mitral regurgitation  Severe by TEE in 1/16 Mod by Echocardiogram 8/17; 12/21; 05/2023 Carotid stenosis US  04/05/20: Bilat ICA 1-39  Anemia of chronic disease         Julia [name] is a 83 y.o. female who returns for post hospital follow up. She was last seen by Dr. Passe in 01/2023. She was admitted to [facility] 1/14-1/15 with chest pain and new onset atrial fibrillation with controlled rate. Her hsT were neg. Her CRP and ESR were both elevated and her TTE showed mod pericardial effusion w/o tamponade. It was suspected that pericarditis likely was the cause of her symptoms. She is anemic and has chronic kidney disease, stage 4. Therefore, anticoagulation was deferred for now and NSAIDs were not recommended. She was started on low dose Colchicine . She is to have repeat CRP, ESR today. She will also need a follow up limited TTE to reassess the  effusion. If her anemia is stable and there is no increased size of her effusion, she will need to start anticoagulation. Of note, the patient's PCP called our office last week and Dr. Passe recommended starting Eliquis  at that time.   Discussed the use of AI scribe software for clinical note transcription with the patient, who gave verbal consent to proceed.  History of Present Illness   Her symptoms improved with colchicine  during her hospital stay. No current chest discomfort, shortness of breath, or swelling. She can lay flat without pain. She has not had pleuritic chest pain. She has not had syncope. She has financial concerns about the cost of Eliquis , as she only has Medicare A and B, which does not cover the medication.      Review of Systems  Gastrointestinal:  Negative for hematochezia and melena.  Genitourinary:  Negative for hematuria.  -See HPI    Studies Reviewed:   EKG Interpretation Date/Time:  Wednesday July 02 2023 14:05:52 EST Ventricular Rate:  65 PR Interval:    QRS Duration:  156 QT Interval:  492 QTC Calculation: 511 R Axis:   8  Text Interpretation: Atrial fibrillation Right bundle branch block Septal infarct , age undetermined Similar to ECG from 06/10/23 Confirmed by Lelon Hamilton [phone]) on 07/02/2023 2:43:38 PM    Recent Labs    08/06/22 1303 05/26/23 1224 06/10/23 0944 06/11/23 0418 06/23/23 1238  HGB 8.8* 9.3* 9.9* 8.6* 9.9*      Risk Assessment/Calculations:  CHA2DS2-VASc Score = 5   This indicates a 7.2% annual risk of stroke. The patient's score is based upon: CHF History: 1 HTN History: 1 Diabetes History: 0 Stroke History: 0 Vascular Disease History: 0 Age Score: 2 Gender Score: 1            Physical Exam:   VS:  BP 130/80   Pulse 65   Ht 5' 5 (1.651 m)   Wt 149 lb 9.6 oz (67.9 kg)   SpO2 100%   BMI 24.89 kg/m    Wt Readings from Last 3 Encounters:  07/02/23 149 lb 9.6 oz (67.9 kg)  06/10/23 149 lb (67.6 kg)   02/14/23 147 lb 3.2 oz (66.8 kg)    Constitutional:      Appearance: Healthy appearance. Not in distress.  Neck:     Vascular: JVD normal.  Pulmonary:     Breath sounds: Normal breath sounds. No wheezing. No rales.  Cardiovascular:     Normal rate. Irregularly irregular rhythm.     Murmurs: There is a grade 3/6 holosystolic murmur at the LLSB.  Edema:    Peripheral edema absent.  Abdominal:     Palpations: Abdomen is soft.      Assessment and Plan:   Assessment & Plan Persistent atrial fibrillation (HCC) She remains in atrial fibrillation.  Heart rate is controlled.  She just got started on Eliquis  a few days ago.  She is going to have difficulty affording this.  We discussed the role for cardioversion to restore normal sinus rhythm.  I think before we pursue cardioversion, we need to confirm that her effusion has reduced in size or remains stable.  She also needs to be on uninterrupted anticoagulation for at least 3 weeks.  I will also review her case with Dr. Alveta to see if it is worth pursuing cardioversion.  She is currently asymptomatic.  With her mitral regurgitation and enlarged atria, she may not be able to hold sinus rhythm. -Apply for assistance for Eliquis  -Continue Eliquis  2.5 mg twice daily, carvedilol  25 mg twice daily -Follow-up 4 weeks -If cardioversion is not pursued and assistance is not obtained for Eliquis , transition to Coumadin Pericardial effusion Sed rate and CRP were elevated in the hospital.  She had pleuritic chest pain.  She was treated for pericarditis with colchicine .  She is doing well without recurrent chest pain.  There is no rub on exam. -Continue colchicine  0.3 mg daily for total of 3 months -Repeat CRP, ESR today -Arrange limited echo to recheck pericardial effusion Heart failure with improved ejection fraction (HFimpEF) (HCC) Volume status stable.  NYHA II.  She is not currently on diuretic therapy Nonrheumatic mitral valve  regurgitation Moderate by recent echocardiogram. Essential hypertension Blood pressure controlled.  Continue amlodipine  5 mg daily, carvedilol  25 mg twice daily. Anemia of chronic disease Hemoglobin has remained stable.  She receives erythropoietin  injections. CKD (chronic kidney disease) stage 4, GFR 15-29 ml/min (HCC) Followed by Dr. Jerrye at [facility].      Dispo:  Return in about 4 weeks (around 07/30/2023) for Routine Follow Up, w/ Dr. Alveta, or Glendia Ferrier, PA-C.  Signed, Glendia Ferrier, PA-C

## 2023-07-02 NOTE — Assessment & Plan Note (Signed)
 Sed rate and CRP were elevated in the hospital.  She had pleuritic chest pain.  She was treated for pericarditis with colchicine .  She is doing well without recurrent chest pain.  There is no rub on exam. -Continue colchicine  0.3 mg daily for total of 3 months -Repeat CRP, ESR today -Arrange limited echo to recheck pericardial effusion

## 2023-07-02 NOTE — Assessment & Plan Note (Signed)
 Volume status stable.  NYHA II.  She is not currently on diuretic therapy

## 2023-07-02 NOTE — Assessment & Plan Note (Signed)
 Followed by Dr. Yvonnie Heritage at [facility].

## 2023-07-02 NOTE — Assessment & Plan Note (Signed)
 Hemoglobin has remained stable.  She receives erythropoietin  injections.

## 2023-07-02 NOTE — Patient Instructions (Signed)
Medication Instructions:  Your physician recommends that you continue on your current medications as directed. Please refer to the Current Medication list given to you today.  *If you need a refill on your cardiac medications before your next appointment, please call your pharmacy*   Lab Work: TODAY:  CRP & ESR  If you have labs (blood work) drawn today and your tests are completely normal, you will receive your results only by: MyChart Message (if you have MyChart) OR A paper copy in the mail If you have any lab test that is abnormal or we need to change your treatment, we will call you to review the results.   Testing/Procedures: Your physician has requested that you have an Limited Echocardiogram. Echocardiography is a painless test that uses sound waves to create images of your heart. It provides your doctor with information about the size and shape of your heart and how well your heart's chambers and valves are working. This procedure takes approximately one hour. There are no restrictions for this procedure. Please do NOT wear cologne, perfume, aftershave, or lotions (deodorant is allowed). Please arrive 15 minutes prior to your appointment time.  Please note: We ask at that you not bring children with you during ultrasound (echo/ vascular) testing. Due to room size and safety concerns, children are not allowed in the ultrasound rooms during exams. Our front office staff cannot provide observation of children in our lobby area while testing is being conducted. An adult accompanying a patient to their appointment will only be allowed in the ultrasound room at the discretion of the ultrasound technician under special circumstances. We apologize for any inconvenience.    Follow-Up: At [facility], you and your health needs are our priority.  As part of our continuing mission to provide you with exceptional heart care, we have created designated Provider Care Teams.  These Care  Teams include your primary Cardiologist (physician) and Advanced Practice Providers (APPs -  Physician Assistants and Nurse Practitioners) who all work together to provide you with the care you need, when you need it.  We recommend signing up for the patient portal called "MyChart".  Sign up information is provided on this After Visit Summary.  MyChart is used to connect with patients for Virtual Visits (Telemedicine).  Patients are able to view lab/test results, encounter notes, upcoming appointments, etc.  Non-urgent messages can be sent to your provider as well.   To learn more about what you can do with MyChart, go to [email or web address].    Your next appointment:   4 week(s)  Provider:   Kristeen Miss, MD     Other Instructions

## 2023-07-02 NOTE — Assessment & Plan Note (Signed)
Moderate by recent echocardiogram. 

## 2023-07-02 NOTE — Assessment & Plan Note (Addendum)
 She remains in atrial fibrillation.  Heart rate is controlled.  She just got started on Eliquis  a few days ago.  She is going to have difficulty affording this.  We discussed the role for cardioversion to restore normal sinus rhythm.  I think before we pursue cardioversion, we need to confirm that her effusion has reduced in size or remains stable.  She also needs to be on uninterrupted anticoagulation for at least 3 weeks.  I will also review her case with Dr. Alveta to see if it is worth pursuing cardioversion.  She is currently asymptomatic.  With her mitral regurgitation and enlarged atria, she may not be able to hold sinus rhythm. -Apply for assistance for Eliquis  -Continue Eliquis  2.5 mg twice daily, carvedilol  25 mg twice daily -Follow-up 4 weeks -If cardioversion is not pursued and assistance is not obtained for Eliquis , transition to Coumadin

## 2023-07-02 NOTE — Assessment & Plan Note (Signed)
 Blood pressure controlled.  Continue amlodipine  5 mg daily, carvedilol  25 mg twice daily.

## 2023-07-03 ENCOUNTER — Ambulatory Visit (HOSPITAL_COMMUNITY): Payer: Medicare Other | Attending: Internal Medicine

## 2023-07-03 ENCOUNTER — Encounter: Payer: Self-pay | Admitting: *Deleted

## 2023-07-03 DIAGNOSIS — I3139 Other pericardial effusion (noninflammatory): Secondary | ICD-10-CM | POA: Diagnosis not present

## 2023-07-03 DIAGNOSIS — N184 Chronic kidney disease, stage 4 (severe): Secondary | ICD-10-CM | POA: Diagnosis not present

## 2023-07-03 DIAGNOSIS — I340 Nonrheumatic mitral (valve) insufficiency: Secondary | ICD-10-CM | POA: Insufficient documentation

## 2023-07-03 DIAGNOSIS — I5032 Chronic diastolic (congestive) heart failure: Secondary | ICD-10-CM | POA: Diagnosis not present

## 2023-07-03 DIAGNOSIS — D638 Anemia in other chronic diseases classified elsewhere: Secondary | ICD-10-CM | POA: Insufficient documentation

## 2023-07-03 DIAGNOSIS — I10 Essential (primary) hypertension: Secondary | ICD-10-CM | POA: Diagnosis not present

## 2023-07-03 DIAGNOSIS — I4819 Other persistent atrial fibrillation: Secondary | ICD-10-CM | POA: Insufficient documentation

## 2023-07-03 LAB — ECHOCARDIOGRAM LIMITED
AR max vel: 1.14 cm2
AV Area VTI: 1.03 cm2
AV Area mean vel: 1 cm2
AV Mean grad: 10.5 mm[Hg]
AV Peak grad: 20.8 mm[Hg]
Ao pk vel: 2.28 m/s
Area-P 1/2: 2.56 cm2
MV M vel: 5.86 m/s
MV Peak grad: 137.5 mm[Hg]
MV VTI: 0.88 cm2
P 1/2 time: 696 ms
Radius: 0.6 cm
S' Lateral: 3.2 cm

## 2023-07-03 LAB — C-REACTIVE PROTEIN: CRP: 73 mg/L — ABNORMAL HIGH (ref 0–10)

## 2023-07-03 NOTE — Addendum Note (Signed)
 Addended by: Angelina Kempf on: 07/03/2023 06:56 AM   Modules accepted: Orders

## 2023-07-04 ENCOUNTER — Telehealth: Payer: Self-pay | Admitting: *Deleted

## 2023-07-04 ENCOUNTER — Other Ambulatory Visit: Payer: Self-pay | Admitting: *Deleted

## 2023-07-04 ENCOUNTER — Other Ambulatory Visit (HOSPITAL_COMMUNITY): Payer: Medicare Other

## 2023-07-04 DIAGNOSIS — Z79899 Other long term (current) drug therapy: Secondary | ICD-10-CM

## 2023-07-04 DIAGNOSIS — I4819 Other persistent atrial fibrillation: Secondary | ICD-10-CM

## 2023-07-04 DIAGNOSIS — I3139 Other pericardial effusion (noninflammatory): Secondary | ICD-10-CM

## 2023-07-04 LAB — SEDIMENTATION RATE: Sed Rate: 92 mm/h — ABNORMAL HIGH (ref 0–40)

## 2023-07-04 NOTE — Telephone Encounter (Signed)
-----   Message from Glendia Ferrier sent at 07/04/2023  7:59 AM EST ----- Results sent to Donia JINNY Pitt via MyChart. See MyChart comments below: PLAN: -Repeat CRP, ESR in 2 weeks  Ms. Mondesir  The other marker of inflammation remains elevated (sed rate). Your echocardiogram yesterday showed that the effusion is stable.  Continue current medications/treatment plan and follow up as scheduled.  Glendia Ferrier, PA-C    07/04/2023 7:55 AM

## 2023-07-18 ENCOUNTER — Other Ambulatory Visit (HOSPITAL_COMMUNITY): Payer: Self-pay

## 2023-07-21 ENCOUNTER — Ambulatory Visit (HOSPITAL_COMMUNITY)
Admission: RE | Admit: 2023-07-21 | Discharge: 2023-07-21 | Disposition: A | Discharge status: Home / Self Care | Payer: Medicare Other | Source: Ambulatory Visit | Attending: Nephrology | Admitting: Nephrology

## 2023-07-21 VITALS — BP 136/76 | HR 58 | Temp 97.1°F | Resp 17

## 2023-07-21 DIAGNOSIS — N184 Chronic kidney disease, stage 4 (severe): Secondary | ICD-10-CM | POA: Insufficient documentation

## 2023-07-21 DIAGNOSIS — I3139 Other pericardial effusion (noninflammatory): Secondary | ICD-10-CM | POA: Diagnosis not present

## 2023-07-21 DIAGNOSIS — D631 Anemia in chronic kidney disease: Secondary | ICD-10-CM | POA: Diagnosis not present

## 2023-07-21 DIAGNOSIS — Z79899 Other long term (current) drug therapy: Secondary | ICD-10-CM | POA: Diagnosis not present

## 2023-07-21 DIAGNOSIS — D638 Anemia in other chronic diseases classified elsewhere: Secondary | ICD-10-CM | POA: Insufficient documentation

## 2023-07-21 DIAGNOSIS — I4819 Other persistent atrial fibrillation: Secondary | ICD-10-CM | POA: Diagnosis not present

## 2023-07-21 LAB — IRON AND TIBC
Iron: 32 ug/dL (ref 28–170)
Saturation Ratios: 13 % (ref 10.4–31.8)
TIBC: 245 ug/dL — ABNORMAL LOW (ref 250–450)
UIBC: 213 ug/dL

## 2023-07-21 LAB — POCT HEMOGLOBIN-HEMACUE: Hemoglobin: 8.4 g/dL — ABNORMAL LOW (ref 12.0–15.0)

## 2023-07-21 LAB — FERRITIN: Ferritin: 429 ng/mL — ABNORMAL HIGH (ref 11–307)

## 2023-07-21 MED ORDER — EPOETIN ALFA-EPBX 10000 UNIT/ML IJ SOLN
INTRAMUSCULAR | Status: DC
Start: 2023-07-21 — End: 2023-07-22
  Filled 2023-07-21: qty 1

## 2023-07-21 MED ORDER — EPOETIN ALFA-EPBX 10000 UNIT/ML IJ SOLN
10000.0000 [IU] | INTRAMUSCULAR | Status: DC
  Administered 2023-07-21: 10000 [IU] via SUBCUTANEOUS

## 2023-07-22 ENCOUNTER — Other Ambulatory Visit (HOSPITAL_COMMUNITY): Payer: Medicare Other

## 2023-07-22 LAB — SEDIMENTATION RATE: Sed Rate: 73 mm/h — ABNORMAL HIGH (ref 0–40)

## 2023-07-22 LAB — C-REACTIVE PROTEIN: CRP: 54 mg/L — ABNORMAL HIGH (ref 0–10)

## 2023-07-24 ENCOUNTER — Telehealth: Payer: Self-pay | Admitting: *Deleted

## 2023-07-24 MED ORDER — COLCHICINE 0.6 MG PO TABS: 0.3000 mg | ORAL_TABLET | Freq: Every day | ORAL | 1 refills | Status: DC

## 2023-07-24 NOTE — Telephone Encounter (Signed)
-----   Message from [place] sent at 07/23/2023  5:16 PM EST ----- She should remain on Colchicine for at least 3 months. The medicine service probably did not put refills on it.  PLAN:  -Restart Colchicine 0.3 mg once daily #30, 1 refill Tereso Newcomer, PA-C    07/23/2023 5:16 PM

## 2023-07-24 NOTE — Telephone Encounter (Signed)
 Pt aware she should be on Colchicine 0.6 taking 1/2 tablet daily for 2 more months.  Sent in refill X's 1 to [facility], per pt's request.  She will take it March & April then stop.

## 2023-07-30 ENCOUNTER — Ambulatory Visit: Payer: Medicare Other | Attending: Physician Assistant | Admitting: Physician Assistant

## 2023-07-30 ENCOUNTER — Encounter: Payer: Self-pay | Admitting: Physician Assistant

## 2023-07-30 VITALS — BP 120/80 | HR 68 | Ht 65.0 in | Wt 147.6 lb

## 2023-07-30 DIAGNOSIS — I4819 Other persistent atrial fibrillation: Secondary | ICD-10-CM | POA: Diagnosis not present

## 2023-07-30 DIAGNOSIS — I5032 Chronic diastolic (congestive) heart failure: Secondary | ICD-10-CM | POA: Insufficient documentation

## 2023-07-30 DIAGNOSIS — I350 Nonrheumatic aortic (valve) stenosis: Secondary | ICD-10-CM

## 2023-07-30 DIAGNOSIS — I340 Nonrheumatic mitral (valve) insufficiency: Secondary | ICD-10-CM

## 2023-07-30 DIAGNOSIS — N184 Chronic kidney disease, stage 4 (severe): Secondary | ICD-10-CM

## 2023-07-30 DIAGNOSIS — I3139 Other pericardial effusion (noninflammatory): Secondary | ICD-10-CM | POA: Diagnosis not present

## 2023-07-30 DIAGNOSIS — I5020 Unspecified systolic (congestive) heart failure: Secondary | ICD-10-CM

## 2023-07-30 NOTE — Assessment & Plan Note (Signed)
 Continue follow-up with nephrology as planned.

## 2023-07-30 NOTE — Assessment & Plan Note (Signed)
 Rate well controlled on exam. I have previously reviewed with Dr. Elease Hashimoto. She will not likely maintain NSR with biatrial enlargement, mitral regurgitation. Therefore, we will pursue rate control strategy and anticoagulation.  - Continue Eliquis 2.5 mg twice daily - Continue carvedilol 25 mg twice daily - Assist with paperwork for Eliquis assistance program - Consider switching to Pradaxa or Coumadin if Eliquis assistance is not approved

## 2023-07-30 NOTE — Progress Notes (Signed)
 Cardiology Office Note:    Date:  07/30/2023  ID:  Julia [name], DOB [birth year], MRN [id number] PCP: Farris Has, MD   [facility] Prov[facility] Cardiologist:  Kristeen Miss, MD       Patient Profile:      HFimpEF (heart failure with improved ejection fraction)  Non-ischemic cardiomyopathy / Cath in 12/15 w normal coronary arteries  Echocardiogram 11/15: EF 25 TEE 1/16: EF 35-40 TTE 12/28/15: Mild conc LVH, EF 55-60, no RWMA, Gr 1 DD, mildly increased transvalvular AV velocity (peak 217 m/s), mild MVP, mod MR, severe LAE  TTE 05/05/20: EF 60-65, no RWMA, moderate LVH, GRII DD, normal RVSF, normal PASP, RVSP 26.7, severe LAE, MVP, moderate MR, mild MS mean gradient 5, mild AI, mild-moderate AV sclerosis, RAP 8 TTE 06/10/23: EF 60-65, no RWMA, mild LVH, normal RVSF, moderate pulmonary hypertension, RVSP 58.7, severe LAE, moderate RAE, moderate pericardial effusion, no tamponade, moderate MR, mild AI, mild AS (mean 12, V-max 249 cm/s, DI 0.56), RAP 8 Limited TTE 07/03/23: EF 60-65, no RWMA, mild LVH, mildly reduced RVSF, mildly elevated PASP, severe LAE, moderate effusion (no evidence of tamponade), severe MR, mild MS, mean MV gradient 5, moderate AV calcification, mild AR, mild to moderate aortic stenosis (V-max 230 cm/s, mean gradient 11, DI 0.4) Persistent atrial fibrillation  Pericardial Effusion Treated for suspected pericarditis TTE 06/10/2023: Moderate TTE 07/03/2023: Moderate Chronic kidney disease stage IV Hypertension  Mitral regurgitation  Severe by TEE in 1/16, Limited TTE 06/2023 Mod by Echocardiogram 8/17; 12/21; 05/2023 Mild to mod aortic stenosis  Carotid stenosis [place] 04/05/20: Bilat ICA 1-39  Anemia of chronic disease          Discussed the use of AI scribe software for clinical note transcription with the patient, who gave verbal consent to proceed. History of Present Illness Julia [name] is a 83 y.o. female who returns for follow up of AFib, pericardial effusion.  She was last seen in 06/2023. She had been admitted with chest pain and new onset AFib. TTE showed mod effusion and CRP/ESR were elevated. She was placed on colchicine for suspected pericarditis. At last visit, she was doing well w/o recurrent chest pain. CRP and ESR were still elevated.  Follow-up limited echo demonstrated stable, moderate pericardial effusion without evidence of tamponade.  She was continued on colchicine I did review her case with Dr. Elease Hashimoto.  Given her mitral regurgitation and biatrial enlargement it was felt that she would not likely maintain sinus rhythm.  Therefore, continued rate control and anticoagulation was recommended.  She is here with her daughter. She is not on diuretic therapy and reports no symptoms of volume overload. No chest pain, pressure, shortness of breath, difficulty breathing when lying flat, syncope, bleeding, or melena. No pleuritic pain.   Review of Systems  Gastrointestinal:  Negative for hematochezia and melena.  Genitourinary:  Negative for hematuria.  -See HPI    Studies Reviewed:       07/02/23: CRP 73, ESR 92 07/21/23: CRP 54, ESR 73 Results     Risk Assessment/Calculations:    CHA2DS2-VASc Score = 5   This indicates a 7.2% annual risk of stroke. The patient's score is based upon: CHF History: 1 HTN History: 1 Diabetes History: 0 Stroke History: 0 Vascular Disease History: 0 Age Score: 2 Gender Score: 1            Physical Exam:   VS:  BP 120/80   Pulse 68   Ht 5\' 5"  (1.651 m)  Wt 147 lb 9.6 oz (67 kg)   SpO2 94%   BMI 24.56 kg/m    Wt Readings from Last 3 Encounters:  07/30/23 147 lb 9.6 oz (67 kg)  07/02/23 149 lb 9.6 oz (67.9 kg)  06/10/23 149 lb (67.6 kg)    Constitutional:      Appearance: Healthy appearance. Not in distress.  Neck:     Vascular: JVR present. JVD normal.  Pulmonary:     Breath sounds: Normal breath sounds. No wheezing. No rales.  Cardiovascular:     Normal rate. Regular rhythm.     Murmurs:  There is a grade 2/6 systolic murmur at the URSB and LLSB.  Edema:    Peripheral edema absent.  Abdominal:     Palpations: Abdomen is soft.       Assessment and Plan:   Assessment & Plan Persistent atrial fibrillation (HCC) Rate well controlled on exam. I have previously reviewed with Dr. Elease Hashimoto. She will not likely maintain NSR with biatrial enlargement, mitral regurgitation. Therefore, we will pursue rate control strategy and anticoagulation.  - Continue Eliquis 2.5 mg twice daily - Continue carvedilol 25 mg twice daily - Assist with paperwork for Eliquis assistance program - Consider switching to Pradaxa or Coumadin if Eliquis assistance is not approved Pericardial effusion Suspect this was related to pericarditis. Asymptomatic. Moderate pericardial effusion noted on follow-up limited echo on 07/03/2023.  No evidence of tamponade.  CRP and ESR remain persistently elevated.  Question if this is related to erythropoietin injections. - Continue colchicine 0.3 mg daily for three months, then stop - Schedule follow-up limited echo in mid to late May to recheck effusion - Follow up 3 mos Heart failure with improved ejection fraction (HFimpEF) (HCC) EF remains normal.  She is not on diuretic therapy.   - Continue carvedilol 25 mg twice daily. Nonrheumatic mitral valve regurgitation Severe mitral regurgitation noted on limited echocardiogram in February 2025. Asymptomatic. - Monitor with surveillance echocardiograms Nonrheumatic aortic valve stenosis Mild to moderate aortic stenosis noted on echocardiogram in February 2025 with mean gradient 11, dimensionless index 0.4, Vmax 230 cm/s. - Monitor with surveillance echocardiograms CKD (chronic kidney disease) stage 4, GFR 15-29 ml/min (HCC) Continue follow-up with nephrology as planned.        Dispo:  Return in about 3 months (around 10/30/2023) for Routine Follow Up, w/ Dr. Elease Hashimoto, or Tereso Newcomer, PA-C.  Signed, Tereso Newcomer, PA-C

## 2023-07-30 NOTE — Assessment & Plan Note (Signed)
 EF remains normal.  She is not on diuretic therapy.   - Continue carvedilol 25 mg twice daily.

## 2023-07-30 NOTE — Assessment & Plan Note (Signed)
 Suspect this was related to pericarditis. Asymptomatic. Moderate pericardial effusion noted on follow-up limited echo on 07/03/2023.  No evidence of tamponade.  CRP and ESR remain persistently elevated.  Question if this is related to erythropoietin injections. - Continue colchicine 0.3 mg daily for three months, then stop - Schedule follow-up limited echo in mid to late May to recheck effusion - Follow up 3 mos

## 2023-07-30 NOTE — Patient Instructions (Signed)
 Medication Instructions:  Your physician recommends that you continue on your current medications as directed. Please refer to the Current Medication list given to you today.  *If you need a refill on your cardiac medications before your next appointment, please call your pharmacy*   Lab Work: None ordered  If you have labs (blood work) drawn today and your tests are completely normal, you will receive your results only by: MyChart Message (if you have MyChart) OR A paper copy in the mail If you have any lab test that is abnormal or we need to change your treatment, we will call you to review the results.   Testing/Procedures: Your physician has requested that you have an  limited echocardiogram mid-late May. Echocardiography is a painless test that uses sound waves to create images of your heart. It provides your doctor with information about the size and shape of your heart and how well your heart's chambers and valves are working. This procedure takes approximately one hour. There are no restrictions for this procedure. Please do NOT wear cologne, perfume, aftershave, or lotions (deodorant is allowed). Please arrive 15 minutes prior to your appointment time.  Please note: We ask at that you not bring children with you during ultrasound (echo/ vascular) testing. Due to room size and safety concerns, children are not allowed in the ultrasound rooms during exams. Our front office staff cannot provide observation of children in our lobby area while testing is being conducted. An adult accompanying a patient to their appointment will only be allowed in the ultrasound room at the discretion of the ultrasound technician under special circumstances. We apologize for any inconvenience.    Follow-Up: At [facility], you and your health needs are our priority.  As part of our continuing mission to provide you with exceptional heart care, we have created designated Provider Care Teams.   These Care Teams include your primary Cardiologist (physician) and Advanced Practice Providers (APPs -  Physician Assistants and Nurse Practitioners) who all work together to provide you with the care you need, when you need it.  We recommend signing up for the patient portal called "MyChart".  Sign up information is provided on this After Visit Summary.  MyChart is used to connect with patients for Virtual Visits (Telemedicine).  Patients are able to view lab/test results, encounter notes, upcoming appointments, etc.  Non-urgent messages can be sent to your provider as well.   To learn more about what you can do with MyChart, go to [email or web address].    Your next appointment:   1 WEEK AFTER ECHO  Provider:   Kristeen Miss, MD  or Tereso Newcomer, PA-C         Other Instructions     1st Floor: - Lobby - Registration  - Pharmacy  - Lab - Cafe  2nd Floor: - PV Lab - Diagnostic Testing (echo, CT, nuclear med)  3rd Floor: - Vacant  4th Floor: - TCTS (cardiothoracic surgery) - AFib Clinic - Structural Heart Clinic - Vascular Surgery  - Vascular Ultrasound  5th Floor: - [facility]Care Cardiology (general and EP) - Clinical Pharmacy for coumadin, hypertension, lipid, weight-loss medications, and med management appointments    Valet parking services will be available as well.

## 2023-07-30 NOTE — Assessment & Plan Note (Signed)
 Mild to moderate aortic stenosis noted on echocardiogram in February 2025 with mean gradient 11, dimensionless index 0.4, Vmax 230 cm/s. - Monitor with surveillance echocardiograms

## 2023-07-30 NOTE — Assessment & Plan Note (Signed)
 Severe mitral regurgitation noted on limited echocardiogram in February 2025. Asymptomatic. - Monitor with surveillance echocardiograms

## 2023-08-04 ENCOUNTER — Telehealth: Payer: Self-pay | Admitting: Physician Assistant

## 2023-08-04 ENCOUNTER — Ambulatory Visit (HOSPITAL_COMMUNITY)
Admission: RE | Admit: 2023-08-04 | Discharge: 2023-08-04 | Disposition: A | Discharge status: Home / Self Care | Payer: Medicare Other | Source: Ambulatory Visit | Attending: Nephrology | Admitting: Nephrology

## 2023-08-04 ENCOUNTER — Encounter: Payer: Self-pay | Admitting: Cardiovascular Disease

## 2023-08-04 DIAGNOSIS — I5030 Unspecified diastolic (congestive) heart failure: Secondary | ICD-10-CM | POA: Diagnosis not present

## 2023-08-04 DIAGNOSIS — N189 Chronic kidney disease, unspecified: Secondary | ICD-10-CM | POA: Diagnosis not present

## 2023-08-04 DIAGNOSIS — R7303 Prediabetes: Secondary | ICD-10-CM | POA: Diagnosis not present

## 2023-08-04 DIAGNOSIS — D631 Anemia in chronic kidney disease: Secondary | ICD-10-CM | POA: Insufficient documentation

## 2023-08-04 DIAGNOSIS — N184 Chronic kidney disease, stage 4 (severe): Secondary | ICD-10-CM | POA: Insufficient documentation

## 2023-08-04 DIAGNOSIS — I129 Hypertensive chronic kidney disease with stage 1 through stage 4 chronic kidney disease, or unspecified chronic kidney disease: Secondary | ICD-10-CM | POA: Diagnosis not present

## 2023-08-04 DIAGNOSIS — N2581 Secondary hyperparathyroidism of renal origin: Secondary | ICD-10-CM | POA: Diagnosis not present

## 2023-08-04 MED ORDER — SODIUM CHLORIDE 0.9 % IV SOLN
510.0000 mg | Freq: Once | INTRAVENOUS | Status: AC
  Administered 2023-08-04: 510 mg via INTRAVENOUS
  Filled 2023-08-04: qty 510

## 2023-08-04 NOTE — Telephone Encounter (Signed)
 Patient dropped off Eliquis Patient Assistance forms at front desk. Will be upfront until the end of the day then will go in [employer]. Thank you

## 2023-08-05 NOTE — Telephone Encounter (Signed)
 Is this a potential side effect?  She has chronic kidney disease and is on Colchicine 0.3 mg once daily. Tereso Newcomer, PA-C    08/05/2023 7:28 AM

## 2023-08-06 MED ORDER — COLCHICINE 0.6 MG PO TABS: 0.3000 mg | ORAL_TABLET | ORAL | Status: DC

## 2023-08-06 NOTE — Telephone Encounter (Signed)
 Spoke with pt.  She has been made aware to reduce the Colchicine 0.6 mg 1/2 tablet every 3 days.  She will let us know if her symptoms do not improve.

## 2023-08-08 NOTE — Telephone Encounter (Signed)
 Patient assistance application has been placed on Tereso Newcomer, PA-C's desk.

## 2023-08-11 NOTE — Telephone Encounter (Signed)
 Completed application faxed to [facility] [phone].  Sent to level scanning to be scanned into pt's chart.

## 2023-08-15 ENCOUNTER — Other Ambulatory Visit (HOSPITAL_COMMUNITY): Payer: Self-pay

## 2023-08-18 ENCOUNTER — Encounter (HOSPITAL_COMMUNITY)
Admission: RE | Admit: 2023-08-18 | Discharge: 2023-08-18 | Disposition: A | Discharge status: Home / Self Care | Payer: Medicare Other | Source: Ambulatory Visit | Attending: Nephrology | Admitting: Nephrology

## 2023-08-18 VITALS — BP 129/64 | HR 61 | Temp 97.1°F | Resp 17

## 2023-08-18 DIAGNOSIS — N184 Chronic kidney disease, stage 4 (severe): Secondary | ICD-10-CM | POA: Diagnosis not present

## 2023-08-18 DIAGNOSIS — D631 Anemia in chronic kidney disease: Secondary | ICD-10-CM | POA: Insufficient documentation

## 2023-08-18 DIAGNOSIS — D638 Anemia in other chronic diseases classified elsewhere: Secondary | ICD-10-CM | POA: Insufficient documentation

## 2023-08-18 LAB — IRON AND TIBC
Iron: 39 ug/dL (ref 28–170)
Saturation Ratios: 17 % (ref 10.4–31.8)
TIBC: 230 ug/dL — ABNORMAL LOW (ref 250–450)
UIBC: 191 ug/dL

## 2023-08-18 LAB — FERRITIN: Ferritin: 739 ng/mL — ABNORMAL HIGH (ref 11–307)

## 2023-08-18 LAB — POCT HEMOGLOBIN-HEMACUE: Hemoglobin: 9.1 g/dL — ABNORMAL LOW (ref 12.0–15.0)

## 2023-08-18 MED ORDER — EPOETIN ALFA-EPBX 10000 UNIT/ML IJ SOLN
20000.0000 [IU] | INTRAMUSCULAR | Status: DC
  Administered 2023-08-18: 20000 [IU] via SUBCUTANEOUS

## 2023-08-18 MED ORDER — EPOETIN ALFA-EPBX 10000 UNIT/ML IJ SOLN
INTRAMUSCULAR | Status: AC
  Filled 2023-08-18: qty 2

## 2023-09-12 ENCOUNTER — Other Ambulatory Visit (HOSPITAL_COMMUNITY): Payer: Self-pay | Admitting: *Deleted

## 2023-09-15 ENCOUNTER — Ambulatory Visit (HOSPITAL_COMMUNITY)
Admission: RE | Admit: 2023-09-15 | Discharge: 2023-09-15 | Disposition: A | Discharge status: Home / Self Care | Source: Ambulatory Visit | Attending: Nephrology | Admitting: Nephrology

## 2023-09-15 VITALS — BP 128/72 | HR 62 | Temp 96.5°F | Resp 18

## 2023-09-15 DIAGNOSIS — D631 Anemia in chronic kidney disease: Secondary | ICD-10-CM | POA: Diagnosis not present

## 2023-09-15 DIAGNOSIS — N184 Chronic kidney disease, stage 4 (severe): Secondary | ICD-10-CM | POA: Insufficient documentation

## 2023-09-15 DIAGNOSIS — D638 Anemia in other chronic diseases classified elsewhere: Secondary | ICD-10-CM | POA: Insufficient documentation

## 2023-09-15 LAB — IRON AND TIBC
Iron: 25 ug/dL — ABNORMAL LOW (ref 28–170)
Saturation Ratios: 13 % (ref 10.4–31.8)
TIBC: 197 ug/dL — ABNORMAL LOW (ref 250–450)
UIBC: 172 ug/dL

## 2023-09-15 LAB — FERRITIN: Ferritin: 760 ng/mL — ABNORMAL HIGH (ref 11–307)

## 2023-09-15 LAB — POCT HEMOGLOBIN-HEMACUE: Hemoglobin: 8.4 g/dL — ABNORMAL LOW (ref 12.0–15.0)

## 2023-09-15 MED ORDER — EPOETIN ALFA 20000 UNIT/ML IJ SOLN: 20000.0000 [IU] | Freq: Once | INTRAMUSCULAR | Status: AC

## 2023-09-15 MED ORDER — EPOETIN ALFA 20000 UNIT/ML IJ SOLN
INTRAMUSCULAR | Status: AC
  Filled 2023-09-15: qty 1

## 2023-09-15 MED ORDER — EPOETIN ALFA 2000 UNIT/ML IJ SOLN
INTRAMUSCULAR | Status: AC
  Administered 2023-09-15: 2000 [IU]
  Filled 2023-09-15: qty 1

## 2023-09-15 MED ORDER — EPOETIN ALFA 20000 UNIT/ML IJ SOLN
INTRAMUSCULAR | Status: AC
  Administered 2023-09-15: 18000 [IU] via SUBCUTANEOUS
  Filled 2023-09-15: qty 1

## 2023-09-29 ENCOUNTER — Encounter (HOSPITAL_COMMUNITY)

## 2023-10-09 ENCOUNTER — Other Ambulatory Visit (HOSPITAL_COMMUNITY): Payer: Self-pay | Admitting: *Deleted

## 2023-10-10 ENCOUNTER — Ambulatory Visit (HOSPITAL_COMMUNITY)
Admission: RE | Admit: 2023-10-10 | Discharge: 2023-10-10 | Disposition: A | Discharge status: Home / Self Care | Source: Ambulatory Visit | Attending: Internal Medicine | Admitting: Internal Medicine

## 2023-10-10 ENCOUNTER — Ambulatory Visit (HOSPITAL_COMMUNITY)
Admission: RE | Admit: 2023-10-10 | Discharge: 2023-10-10 | Disposition: A | Discharge status: Home / Self Care | Source: Ambulatory Visit | Attending: Nephrology | Admitting: Nephrology

## 2023-10-10 VITALS — BP 158/106 | HR 55 | Temp 97.7°F | Resp 17

## 2023-10-10 DIAGNOSIS — I083 Combined rheumatic disorders of mitral, aortic and tricuspid valves: Secondary | ICD-10-CM

## 2023-10-10 DIAGNOSIS — D638 Anemia in other chronic diseases classified elsewhere: Secondary | ICD-10-CM

## 2023-10-10 DIAGNOSIS — I3139 Other pericardial effusion (noninflammatory): Secondary | ICD-10-CM | POA: Diagnosis not present

## 2023-10-10 LAB — ECHOCARDIOGRAM LIMITED
MV M vel: 6.6 m/s
MV Peak grad: 174.2 mmHg
Radius: 0.9 cm
S' Lateral: 3.3 cm

## 2023-10-10 LAB — POCT HEMOGLOBIN-HEMACUE: Hemoglobin: 8.4 g/dL — ABNORMAL LOW (ref 12.0–15.0)

## 2023-10-10 MED ORDER — EPOETIN ALFA-EPBX 20000 UNIT/ML IJ SOLN
20000.0000 [IU] | Freq: Once | INTRAMUSCULAR | Status: DC
Start: 2023-10-10 — End: 2023-10-10
  Filled 2023-10-10: qty 1

## 2023-10-10 MED ORDER — SODIUM CHLORIDE 0.9 % IV SOLN
510.0000 mg | Freq: Once | INTRAVENOUS | Status: AC
  Administered 2023-10-10: 510 mg via INTRAVENOUS
  Filled 2023-10-10: qty 510

## 2023-10-10 MED ORDER — EPOETIN ALFA-EPBX 10000 UNIT/ML IJ SOLN
10000.0000 [IU] | Freq: Once | INTRAMUSCULAR | Status: AC
  Administered 2023-10-10: 10000 [IU] via SUBCUTANEOUS

## 2023-10-10 MED ORDER — EPOETIN ALFA-EPBX 10000 UNIT/ML IJ SOLN
INTRAMUSCULAR | Status: AC
  Filled 2023-10-10: qty 1

## 2023-10-10 MED ORDER — CLONIDINE HCL 0.1 MG PO TABS: 0.1000 mg | ORAL_TABLET | Freq: Once | ORAL | Status: DC | PRN

## 2023-10-12 ENCOUNTER — Ambulatory Visit: Payer: Self-pay | Admitting: Physician Assistant

## 2023-10-12 DIAGNOSIS — I3139 Other pericardial effusion (noninflammatory): Secondary | ICD-10-CM

## 2023-10-13 ENCOUNTER — Ambulatory Visit: Attending: Physician Assistant | Admitting: Physician Assistant

## 2023-10-13 ENCOUNTER — Encounter: Payer: Self-pay | Admitting: Physician Assistant

## 2023-10-13 ENCOUNTER — Inpatient Hospital Stay (HOSPITAL_COMMUNITY): Admission: RE | Admit: 2023-10-13 | Source: Ambulatory Visit

## 2023-10-13 VITALS — BP 134/78 | HR 62 | Ht 65.0 in | Wt 141.0 lb

## 2023-10-13 DIAGNOSIS — I340 Nonrheumatic mitral (valve) insufficiency: Secondary | ICD-10-CM | POA: Insufficient documentation

## 2023-10-13 DIAGNOSIS — I4819 Other persistent atrial fibrillation: Secondary | ICD-10-CM | POA: Insufficient documentation

## 2023-10-13 DIAGNOSIS — I3139 Other pericardial effusion (noninflammatory): Secondary | ICD-10-CM | POA: Diagnosis not present

## 2023-10-13 DIAGNOSIS — I5032 Chronic diastolic (congestive) heart failure: Secondary | ICD-10-CM | POA: Insufficient documentation

## 2023-10-13 NOTE — Assessment & Plan Note (Signed)
 HR controlled. She is asymptomatic. It was decided she would not likely maintain NSR. Rate control Rx has been recommended.  - Continue Eliquis  2.5 mg twice daily - Continue Carvedilol  25 mg twice daily. - Follow up 6 mos

## 2023-10-13 NOTE — Patient Instructions (Signed)
 Medication Instructions:  Stop Colchicine    *If you need a refill on your cardiac medications before your next appointment, please call your pharmacy*   Follow-Up: At [facility], you and your health needs are our priority.  As part of our continuing mission to provide you with exceptional heart care, our providers are all part of one team.  This team includes your primary Cardiologist (physician) and Advanced Practice Providers or APPs (Physician Assistants and Nurse Practitioners) who all work together to provide you with the care you need, when you need it.  Your next appointment:   6 month(s)  Provider:   Marlyse Single, PA-C          We recommend signing up for the patient portal called "MyChart".  Sign up information is provided on this After Visit Summary.  MyChart is used to connect with patients for Virtual Visits (Telemedicine).  Patients are able to view lab/test results, encounter notes, upcoming appointments, etc.  Non-urgent messages can be sent to your provider as well.   To learn more about what you can do with MyChart, go to [email or web address].   Other Instructions

## 2023-10-13 NOTE — Assessment & Plan Note (Signed)
 Pericardial effusion has improved based upon recent echocardiogram. It is now small. She reports significant side effects from colchicine , including stomach upset and fatigue. Effusion size has decreased, and no symptoms have been reported since hospitalization. She has completed > 3 mos of Rx with Colchicine . I think she can stop this now.  - Discontinue colchicine  - Monitor for recurrence of symptoms such as chest discomfort

## 2023-10-13 NOTE — Progress Notes (Signed)
 Cardiology Office Note:    Date:  10/13/2023  ID:  Julia [name], DOB Dec 26, 1940, MRN [id number] PCP: Ronna Coho, MD  [facility] Providers Cardiologist:  Ahmad Alert, MD       Patient Profile:        HFimpEF (heart failure with improved ejection fraction)  Non-ischemic cardiomyopathy / Cath in 12/15 w normal coronary arteries  Echocardiogram 11/15: EF 25 TEE 1/16: EF 35-40 TTE 12/28/15: Mild conc LVH, EF 55-60, no RWMA, Gr 1 DD, mildly increased transvalvular AV velocity (peak 217 m/s), mild MVP, mod MR, severe LAE  TTE 05/05/20: EF 60-65, no RWMA, moderate LVH, GRII DD, normal RVSF, normal PASP, RVSP 26.7, severe LAE, MVP, moderate MR, mild MS mean gradient 5, mild AI, mild-moderate AV sclerosis, RAP 8 TTE 06/10/23: EF 60-65, no RWMA, mild LVH, normal RVSF, moderate pulmonary hypertension, RVSP 58.7, severe LAE, moderate RAE, moderate pericardial effusion, no tamponade, moderate MR, mild AI, mild AS (mean 12, V-max 249 cm/s, DI 0.56), RAP 8 Limited TTE 07/03/23: EF 60-65, no RWMA, mild LVH, mildly reduced RVSF, mildly elevated PASP, severe LAE, moderate effusion (no evidence of tamponade), severe MR, mild MS, mean MV gradient 5, moderate AV calcification, mild AR, mild to moderate aortic stenosis (V-max 230 cm/s, mean gradient 11, DI 0.4) Limited TTE 10/10/23: EF 60-65, no RWMA, mildly reduced RVSF, mod pulm HTN, RVSP 52.1, severe BAE, small pericardial effusion (smaller), ant leaflet MVP, severe MR, mild MS (mean 5.3 mmHg), mild AI, mild to mod AS  Persistent atrial fibrillation  Rate control strategy Pericardial Effusion Treated for suspected pericarditis TTE 06/10/2023: Moderate TTE 07/03/2023: Moderate Chronic kidney disease stage IV Hypertension  Mitral regurgitation  Severe by TEE in 1/16, Limited TTE 06/2023 Mod by Echocardiogram 8/17; 12/21; 05/2023 Mild to mod aortic stenosis  Carotid stenosis US  04/05/20: Bilat ICA 1-39  Anemia of chronic disease                 Discussed the use of AI scribe software for clinical note transcription with the patient, who gave verbal consent to proceed.  History of Present Illness Julia [name] is a 83 y.o. female who returns for follow-up of pericardial effusion, A-fib, CHF, mitral regurgitation.  She was last seen in 07/2023.  She was admitted in January 2025 with chest pain and new onset atrial fibrillation.  Sed rate and CRP were elevated and echocardiogram demonstrated moderate pericardial effusion.  She was treated with colchicine  for suspected pericarditis.  She was kept on a 67-month course of colchicine .  She had a follow-up limited echocardiogram 10/10/2023 which demonstrated improved pericardial effusion.  It is now small.  EF is 60-65.  There is severe MR.  She has been experiencing fatigue as a side effect of colchicine . She feels 'tired' and 'worse' after taking the medication, even at a reduced dose of half a tablet every third day. She has not experienced any issues with bleeding, black stools, or bloody urine while on this medication. She has not had chest symptoms, including pain, pressure, or shortness of breath. No difficulty breathing when lying flat, nocturnal dyspnea, syncope, or dizziness.   Review of Systems  Gastrointestinal:  Negative for hematochezia and melena.  Genitourinary:  Negative for hematuria.  -See HPI       Studies Reviewed:        Results    Risk Assessment/Calculations:    CHA2DS2-VASc Score = 5   This indicates a 7.2% annual risk of stroke. The patient's  score is based upon: CHF History: 1 HTN History: 1 Diabetes History: 0 Stroke History: 0 Vascular Disease History: 0 Age Score: 2 Gender Score: 1            Physical Exam:   VS:  BP 134/78 (BP Location: Right Arm, Patient Position: Sitting, Cuff Size: Normal)   Pulse 62   Ht 5\' 5"  (1.651 m)   Wt 141 lb (64 kg)   SpO2 97%   BMI 23.46 kg/m    Wt Readings from Last 3 Encounters:  10/13/23 141 lb (64 kg)   07/30/23 147 lb 9.6 oz (67 kg)  07/02/23 149 lb 9.6 oz (67.9 kg)    Constitutional:      Appearance: Healthy appearance. Not in distress.  Neck:     Vascular: No JVR. JVD normal.  Pulmonary:     Breath sounds: Normal breath sounds. No wheezing. No rales.  Cardiovascular:     Normal rate. Regular rhythm.     Murmurs: There is a grade 3/6 holosystolic murmur at the LLSB.  Edema:    Peripheral edema absent.  Abdominal:     Palpations: Abdomen is soft.        Assessment and Plan:   Assessment & Plan Pericardial effusion Pericardial effusion has improved based upon recent echocardiogram. It is now small. She reports significant side effects from colchicine , including stomach upset and fatigue. Effusion size has decreased, and no symptoms have been reported since hospitalization. She has completed > 3 mos of Rx with Colchicine . I think she can stop this now.  - Discontinue colchicine  - Monitor for recurrence of symptoms such as chest discomfort Persistent atrial fibrillation (HCC) HR controlled. She is asymptomatic. It was decided she would not likely maintain NSR. Rate control Rx has been recommended.  - Continue Eliquis  2.5 mg twice daily - Continue Carvedilol  25 mg twice daily. - Follow up 6 mos Heart failure with improved ejection fraction (HFimpEF) (HCC) Non-ischemic cardiomyopathy. EF was as low as 25 in 2015. EF improved to normal and remains normal on recent Echocardiogram. Continue Carvedilol  25 mg twice daily.  Nonrheumatic mitral valve regurgitation She has severe MR and mild to mod AS based on recent echocardiogram. She is not having any symptoms of CHF. Will consider repeat echocardiogram again at next appt.         Dispo:  Return in about 6 months (around 04/14/2024) for Routine Follow Up, w/ Marlyse Single, PA-C.  Signed, Marlyse Single, PA-C

## 2023-10-13 NOTE — Assessment & Plan Note (Signed)
 She has severe MR and mild to mod AS based on recent echocardiogram. She is not having any symptoms of CHF. Will consider repeat echocardiogram again at next appt.

## 2023-10-13 NOTE — Assessment & Plan Note (Signed)
 Non-ischemic cardiomyopathy. EF was as low as 25 in 2015. EF improved to normal and remains normal on recent Echocardiogram. Continue Carvedilol  25 mg twice daily.

## 2023-10-22 ENCOUNTER — Telehealth: Payer: Self-pay

## 2023-10-22 NOTE — Telephone Encounter (Signed)
 Auth Submission: NO AUTH NEEDED Site of care: Site of care: [facility] Payer: Medicare A/B Medication & CPT/J Code(s) submitted: Retacrit  Route of submission (phone, fax, portal):  Phone # Fax # Auth type: Buy/Bill PB Units/visits requested: 10000 units every 2 weeks Reference number:  Approval from: 10/22/23 to 06/26/24

## 2023-10-27 ENCOUNTER — Encounter (HOSPITAL_COMMUNITY)
Admission: RE | Admit: 2023-10-27 | Discharge: 2023-10-27 | Disposition: A | Discharge status: Home / Self Care | Source: Ambulatory Visit | Attending: Nephrology | Admitting: Nephrology

## 2023-10-27 VITALS — BP 149/81 | HR 55 | Temp 96.4°F

## 2023-10-27 DIAGNOSIS — D638 Anemia in other chronic diseases classified elsewhere: Secondary | ICD-10-CM

## 2023-10-27 DIAGNOSIS — N184 Chronic kidney disease, stage 4 (severe): Secondary | ICD-10-CM | POA: Insufficient documentation

## 2023-10-27 DIAGNOSIS — D631 Anemia in chronic kidney disease: Secondary | ICD-10-CM | POA: Insufficient documentation

## 2023-10-27 LAB — IRON AND TIBC
Iron: 48 ug/dL (ref 28–170)
Saturation Ratios: 21 % (ref 10.4–31.8)
TIBC: 232 ug/dL — ABNORMAL LOW (ref 250–450)
UIBC: 184 ug/dL

## 2023-10-27 LAB — FERRITIN: Ferritin: 734 ng/mL — ABNORMAL HIGH (ref 11–307)

## 2023-10-27 LAB — POCT HEMOGLOBIN-HEMACUE: Hemoglobin: 10.1 g/dL — ABNORMAL LOW (ref 12.0–15.0)

## 2023-10-27 MED ORDER — EPOETIN ALFA-EPBX 10000 UNIT/ML IJ SOLN
20000.0000 [IU] | INTRAMUSCULAR | Status: DC
  Administered 2023-10-27: 20000 [IU] via SUBCUTANEOUS
  Filled 2023-10-27: qty 2

## 2023-10-27 MED ORDER — EPOETIN ALFA-EPBX 10000 UNIT/ML IJ SOLN
INTRAMUSCULAR | Status: AC
  Filled 2023-10-27: qty 2

## 2023-11-06 DIAGNOSIS — I509 Heart failure, unspecified: Secondary | ICD-10-CM | POA: Diagnosis not present

## 2023-11-06 DIAGNOSIS — R7303 Prediabetes: Secondary | ICD-10-CM | POA: Diagnosis not present

## 2023-11-06 DIAGNOSIS — N183 Chronic kidney disease, stage 3 unspecified: Secondary | ICD-10-CM | POA: Diagnosis not present

## 2023-11-06 DIAGNOSIS — N2581 Secondary hyperparathyroidism of renal origin: Secondary | ICD-10-CM | POA: Diagnosis not present

## 2023-11-06 DIAGNOSIS — E8809 Other disorders of plasma-protein metabolism, not elsewhere classified: Secondary | ICD-10-CM | POA: Diagnosis not present

## 2023-11-06 DIAGNOSIS — D649 Anemia, unspecified: Secondary | ICD-10-CM | POA: Diagnosis not present

## 2023-11-06 DIAGNOSIS — I4891 Unspecified atrial fibrillation: Secondary | ICD-10-CM | POA: Diagnosis not present

## 2023-11-06 DIAGNOSIS — Z136 Encounter for screening for cardiovascular disorders: Secondary | ICD-10-CM | POA: Diagnosis not present

## 2023-11-06 DIAGNOSIS — Z0000 Encounter for general adult medical examination without abnormal findings: Secondary | ICD-10-CM | POA: Diagnosis not present

## 2023-11-06 DIAGNOSIS — I10 Essential (primary) hypertension: Secondary | ICD-10-CM | POA: Diagnosis not present

## 2023-11-07 ENCOUNTER — Other Ambulatory Visit (HOSPITAL_COMMUNITY): Payer: Self-pay | Admitting: *Deleted

## 2023-11-10 ENCOUNTER — Inpatient Hospital Stay (HOSPITAL_COMMUNITY): Admission: RE | Admit: 2023-11-10 | Source: Ambulatory Visit

## 2023-11-17 ENCOUNTER — Encounter (HOSPITAL_COMMUNITY)
Admission: RE | Admit: 2023-11-17 | Discharge: 2023-11-17 | Disposition: A | Discharge status: Home / Self Care | Source: Ambulatory Visit | Attending: Nephrology | Admitting: Nephrology

## 2023-11-17 VITALS — BP 138/82 | HR 60 | Temp 97.2°F | Resp 17

## 2023-11-17 DIAGNOSIS — D631 Anemia in chronic kidney disease: Secondary | ICD-10-CM | POA: Diagnosis not present

## 2023-11-17 DIAGNOSIS — N2581 Secondary hyperparathyroidism of renal origin: Secondary | ICD-10-CM | POA: Diagnosis not present

## 2023-11-17 DIAGNOSIS — I5030 Unspecified diastolic (congestive) heart failure: Secondary | ICD-10-CM | POA: Diagnosis not present

## 2023-11-17 DIAGNOSIS — I129 Hypertensive chronic kidney disease with stage 1 through stage 4 chronic kidney disease, or unspecified chronic kidney disease: Secondary | ICD-10-CM | POA: Diagnosis not present

## 2023-11-17 DIAGNOSIS — R7303 Prediabetes: Secondary | ICD-10-CM | POA: Diagnosis not present

## 2023-11-17 DIAGNOSIS — N184 Chronic kidney disease, stage 4 (severe): Secondary | ICD-10-CM | POA: Diagnosis not present

## 2023-11-17 DIAGNOSIS — D638 Anemia in other chronic diseases classified elsewhere: Secondary | ICD-10-CM

## 2023-11-17 LAB — POCT HEMOGLOBIN-HEMACUE: Hemoglobin: 9.5 g/dL — ABNORMAL LOW (ref 12.0–15.0)

## 2023-11-17 MED ORDER — EPOETIN ALFA-EPBX 10000 UNIT/ML IJ SOLN
INTRAMUSCULAR | Status: AC
Start: 2023-11-17 — End: 2023-11-17
  Filled 2023-11-17: qty 2

## 2023-11-17 MED ORDER — EPOETIN ALFA-EPBX 10000 UNIT/ML IJ SOLN
20000.0000 [IU] | INTRAMUSCULAR | Status: DC
  Administered 2023-11-17: 20000 [IU] via SUBCUTANEOUS

## 2023-11-24 ENCOUNTER — Encounter (HOSPITAL_COMMUNITY)

## 2023-11-25 ENCOUNTER — Telehealth (HOSPITAL_COMMUNITY): Payer: Self-pay | Admitting: Pharmacy Technician

## 2023-11-25 NOTE — Telephone Encounter (Signed)
 Auth Submission: NO AUTH NEEDED Site of care: Site of care: [facility] Payer: Medicare A/B Medication & CPT/J Code(s) submitted: Feraheme  (ferumoxytol ) [id number] Diagnosis Code: D63.1 Route of submission (phone, fax, portal):  Phone # Fax # Auth type: Buy/Bill HB Units/visits requested: 510mg  x 2 doses Reference number:  Approval from: 11/25/23 to 05/26/24    Dagoberto Armour, CPhT [facility] [phone]

## 2023-12-01 ENCOUNTER — Encounter (HOSPITAL_COMMUNITY)
Admission: RE | Admit: 2023-12-01 | Discharge: 2023-12-01 | Disposition: A | Discharge status: Home / Self Care | Source: Ambulatory Visit | Attending: Nephrology | Admitting: Nephrology

## 2023-12-01 VITALS — BP 128/72 | HR 61 | Temp 97.6°F | Resp 16

## 2023-12-01 DIAGNOSIS — D631 Anemia in chronic kidney disease: Secondary | ICD-10-CM | POA: Diagnosis not present

## 2023-12-01 DIAGNOSIS — N189 Chronic kidney disease, unspecified: Secondary | ICD-10-CM | POA: Diagnosis not present

## 2023-12-01 DIAGNOSIS — D638 Anemia in other chronic diseases classified elsewhere: Secondary | ICD-10-CM

## 2023-12-01 LAB — POCT HEMOGLOBIN-HEMACUE: Hemoglobin: 9.6 g/dL — ABNORMAL LOW (ref 12.0–15.0)

## 2023-12-01 MED ORDER — EPOETIN ALFA-EPBX 10000 UNIT/ML IJ SOLN
INTRAMUSCULAR | Status: AC
  Filled 2023-12-01: qty 2

## 2023-12-01 MED ORDER — EPOETIN ALFA-EPBX 10000 UNIT/ML IJ SOLN
20000.0000 [IU] | INTRAMUSCULAR | Status: DC
  Administered 2023-12-01: 20000 [IU] via SUBCUTANEOUS

## 2023-12-01 MED ORDER — SODIUM CHLORIDE 0.9 % IV SOLN
510.0000 mg | Freq: Once | INTRAVENOUS | Status: AC
  Administered 2023-12-01: 510 mg via INTRAVENOUS
  Filled 2023-12-01: qty 510

## 2023-12-15 ENCOUNTER — Encounter (HOSPITAL_COMMUNITY)

## 2023-12-22 ENCOUNTER — Encounter (HOSPITAL_COMMUNITY)
Admission: RE | Admit: 2023-12-22 | Discharge: 2023-12-22 | Disposition: A | Discharge status: Home / Self Care | Source: Ambulatory Visit | Attending: Nephrology | Admitting: Nephrology

## 2023-12-22 VITALS — BP 137/81 | HR 62 | Temp 97.4°F | Resp 16

## 2023-12-22 DIAGNOSIS — R778 Other specified abnormalities of plasma proteins: Secondary | ICD-10-CM | POA: Diagnosis not present

## 2023-12-22 DIAGNOSIS — D638 Anemia in other chronic diseases classified elsewhere: Secondary | ICD-10-CM

## 2023-12-22 DIAGNOSIS — D631 Anemia in chronic kidney disease: Secondary | ICD-10-CM | POA: Diagnosis not present

## 2023-12-22 DIAGNOSIS — N189 Chronic kidney disease, unspecified: Secondary | ICD-10-CM | POA: Diagnosis not present

## 2023-12-22 DIAGNOSIS — E8809 Other disorders of plasma-protein metabolism, not elsewhere classified: Secondary | ICD-10-CM | POA: Diagnosis not present

## 2023-12-22 LAB — FERRITIN: Ferritin: 945 ng/mL — ABNORMAL HIGH (ref 11–307)

## 2023-12-22 LAB — IRON AND TIBC
Iron: 52 ug/dL (ref 28–170)
Saturation Ratios: 25 % (ref 10.4–31.8)
TIBC: 209 ug/dL — ABNORMAL LOW (ref 250–450)
UIBC: 157 ug/dL

## 2023-12-22 LAB — POCT HEMOGLOBIN-HEMACUE: Hemoglobin: 10.1 g/dL — ABNORMAL LOW (ref 12.0–15.0)

## 2023-12-22 MED ORDER — EPOETIN ALFA-EPBX 10000 UNIT/ML IJ SOLN
20000.0000 [IU] | INTRAMUSCULAR | Status: DC
  Administered 2023-12-22: 20000 [IU] via SUBCUTANEOUS

## 2023-12-22 MED ORDER — EPOETIN ALFA-EPBX 10000 UNIT/ML IJ SOLN
INTRAMUSCULAR | Status: AC
Start: 2023-12-22 — End: 2023-12-22
  Filled 2023-12-22: qty 2

## 2023-12-29 ENCOUNTER — Inpatient Hospital Stay (HOSPITAL_BASED_OUTPATIENT_CLINIC_OR_DEPARTMENT_OTHER): Admitting: Hematology and Oncology

## 2023-12-29 ENCOUNTER — Encounter (HOSPITAL_COMMUNITY)

## 2023-12-29 ENCOUNTER — Encounter (HOSPITAL_COMMUNITY): Payer: Self-pay

## 2023-12-29 ENCOUNTER — Inpatient Hospital Stay: Attending: Hematology and Oncology

## 2023-12-29 VITALS — BP 137/77 | HR 53 | Temp 97.6°F | Resp 18 | Ht 65.0 in | Wt 142.7 lb

## 2023-12-29 DIAGNOSIS — R7989 Other specified abnormal findings of blood chemistry: Secondary | ICD-10-CM | POA: Diagnosis not present

## 2023-12-29 DIAGNOSIS — Z801 Family history of malignant neoplasm of trachea, bronchus and lung: Secondary | ICD-10-CM | POA: Insufficient documentation

## 2023-12-29 DIAGNOSIS — R778 Other specified abnormalities of plasma proteins: Secondary | ICD-10-CM | POA: Insufficient documentation

## 2023-12-29 DIAGNOSIS — Z800 Family history of malignant neoplasm of digestive organs: Secondary | ICD-10-CM

## 2023-12-29 DIAGNOSIS — Z803 Family history of malignant neoplasm of breast: Secondary | ICD-10-CM

## 2023-12-29 DIAGNOSIS — R779 Abnormality of plasma protein, unspecified: Secondary | ICD-10-CM

## 2023-12-29 DIAGNOSIS — Z87891 Personal history of nicotine dependence: Secondary | ICD-10-CM

## 2023-12-29 LAB — CMP (CANCER CENTER ONLY)
ALT: 11 U/L (ref 0–44)
AST: 11 U/L — ABNORMAL LOW (ref 15–41)
Albumin: 3.6 g/dL (ref 3.5–5.0)
Alkaline Phosphatase: 71 U/L (ref 38–126)
Anion gap: 6 (ref 5–15)
BUN: 36 mg/dL — ABNORMAL HIGH (ref 8–23)
CO2: 30 mmol/L (ref 22–32)
Calcium: 9.6 mg/dL (ref 8.9–10.3)
Chloride: 100 mmol/L (ref 98–111)
Creatinine: 1.92 mg/dL — ABNORMAL HIGH (ref 0.44–1.00)
GFR, Estimated: 25 mL/min — ABNORMAL LOW (ref >60)
Glucose, Bld: 86 mg/dL (ref 70–99)
Potassium: 4.7 mmol/L (ref 3.5–5.1)
Sodium: 136 mmol/L (ref 135–145)
Total Bilirubin: 0.5 mg/dL (ref 0.0–1.2)
Total Protein: 9.3 g/dL — ABNORMAL HIGH (ref 6.5–8.1)

## 2023-12-29 LAB — CBC WITH DIFFERENTIAL (CANCER CENTER ONLY)
Abs Immature Granulocytes: 0.01 K/uL (ref 0.00–0.07)
Basophils Absolute: 0.1 K/uL (ref 0.0–0.1)
Basophils Relative: 1 %
Eosinophils Absolute: 0.2 K/uL (ref 0.0–0.5)
Eosinophils Relative: 3 %
HCT: 32.4 % — ABNORMAL LOW (ref 36.0–46.0)
Hemoglobin: 10.1 g/dL — ABNORMAL LOW (ref 12.0–15.0)
Immature Granulocytes: 0 %
Lymphocytes Relative: 28 %
Lymphs Abs: 1.4 K/uL (ref 0.7–4.0)
MCH: 27.7 pg (ref 26.0–34.0)
MCHC: 31.2 g/dL (ref 30.0–36.0)
MCV: 89 fL (ref 80.0–100.0)
Monocytes Absolute: 0.3 K/uL (ref 0.1–1.0)
Monocytes Relative: 7 %
Neutro Abs: 3 K/uL (ref 1.7–7.7)
Neutrophils Relative %: 61 %
Platelet Count: 324 K/uL (ref 150–400)
RBC: 3.64 MIL/uL — ABNORMAL LOW (ref 3.87–5.11)
RDW: 17.5 % — ABNORMAL HIGH (ref 11.5–15.5)
WBC Count: 5 K/uL (ref 4.0–10.5)
nRBC: 1 % — ABNORMAL HIGH (ref 0.0–0.2)

## 2023-12-29 LAB — LACTATE DEHYDROGENASE: LDH: 173 U/L (ref 98–192)

## 2023-12-29 NOTE — Progress Notes (Unsigned)
 [facility] [phone]   [phone]  INITIAL CONSULT NOTE  Patient Care Team: Kip Righter, MD as PCP - General (Family Medicine) Nahser, Aleene PARAS, MD (Inactive) as PCP - Cardiology (Cardiology)  Hematological/Oncological History # Abnormal SPEP # Elevated Ferritin  11/06/2023: Gamma globulin 3.1 (nml 0.4-1.8) No M protein detected.  12/29/2023: establish care with Dr. Federico   CHIEF COMPLAINTS/PURPOSE OF CONSULTATION:   Abnormal SPEP   HISTORY OF PRESENTING ILLNESS:  Julia [name] 83 y.o. female with medical history significant for CKD, HFrEF, HTN, and mitral regurg who presents for evaluation of abnormal SPEP.   On review of the previous records [name] had labs drawn on 11/06/2023 which showed gammaglobulin at 3.1 with no detectable M protein.  Due to concern for these findings the patient was referred to hematology for further evaluation and management.  On exam today [name] reports that she has had blood issues before in the past.  She notes that she was recently told that she had elevated abnormal protein.  She reports that she has not recently had any infections other than an inflamed pericarditis.  This occurred about 2 months ago when she was having marked and increasing chest pain.  She notes that she has been taking Eliquis  for this.  She notes that she follows with a nephrologist who provides her with Retacrit  and IV Feraheme  on a monthly basis.  She reports that she has not had any other recent symptoms such as bone pain, back pain, or foaming/change in the urine.  On further discussion she reports that her daughter and 2 granddaughters have anemia and that her mother had stomach cancer.  Her father had lung cancer but was a smoker.  She is a former smoker having quit 40 years ago.  She notes that she never drinks any alcohol.  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  She otherwise denies  any fevers, chills, sweats, nausea, vomiting or diarrhea.  A full 10 point ROS is otherwise negative.  MEDICAL HISTORY:  Past Medical History:  Diagnosis Date   CKD (chronic kidney disease)    HFrEF (heart failure with reduced ejection fraction) (HCC)    Non-ischemic cardiomyopathy / Cath in 12/15 w normal coronary arteries  // Echocardiogram 11/15: EF 25 // TEE 1/16: EF 35-40 // Echocardiogram 8/17: EF 55-60    Hypertension    Mitral regurgitation    severe by TEE in 2015 // Echo in 8/17: mod MR // Echo 12/21: EF 60-65, no RWMA, mod LVH, Gr 2 DD, normal RVSF, RVSP 26.7, severe LAE, mild MVP (medial segment of ant leaflet), mod MR, mild MS (mean gradient 5 mmHg), mod calcification of AV, mild AI, mild-mod AV sclerosis w/o stenosis (mean gradient 11 mmHg), trivial post pericardial effusion    SURGICAL HISTORY: No past surgical history on file.  SOCIAL HISTORY: Social History   Socioeconomic History   Marital status: Widowed    Spouse name: Not on file   Number of children: Not on file   Years of education: Not on file   Highest education level: Not on file  Occupational History   Not on file  Tobacco Use   Smoking status: Former   Smokeless tobacco: Never  Vaping Use   Vaping status: Never Used  Substance and Sexual Activity   Alcohol use: No   Drug use: No   Sexual activity: Not on file  Other Topics  Concern   Not on file  Social History Narrative   Not on file   Social Drivers of Health   Financial Resource Strain: Not on file  Food Insecurity: No Food Insecurity (12/29/2023)   Hunger Vital Sign    Worried About Running Out of Food in the Last Year: Never true    Ran Out of Food in the Last Year: Never true  Transportation Needs: No Transportation Needs (12/29/2023)   PRAPARE - Administrator, Civil Service (Medical): No    Lack of Transportation (Non-Medical): No  Physical Activity: Not on file  Stress: Not on file  Social Connections: Moderately  Integrated (06/10/2023)   Social Connection and Isolation Panel    Frequency of Communication with Friends and Family: More than three times a week    Frequency of Social Gatherings with Friends and Family: Once a week    Attends Religious Services: More than 4 times per year    Active Member of [employer] or Organizations: Yes    Attends Banker Meetings: More than 4 times per year    Marital Status: Widowed  Intimate Partner Violence: Not At Risk (12/29/2023)   Humiliation, Afraid, Rape, and Kick questionnaire    Fear of Current or Ex-Partner: No    Emotionally Abused: No    Physically Abused: No    Sexually Abused: No    FAMILY HISTORY: Family History  Problem Relation Age of Onset   Cancer Mother    Hypertension Father    Stroke Father    Breast cancer Sister    Kidney disease Sister    Lung disease Sister    Healthy Brother    Healthy Brother    Diverticulitis Brother    Congestive Heart Failure Sister    Gout Sister    Diabetes Sister    Hypertension Sister    Healthy Daughter    Alcohol abuse Brother     ALLERGIES:  is allergic to penicillins.  MEDICATIONS:  Current Outpatient Medications  Medication Sig Dispense Refill   amLODipine  (NORVASC ) 5 MG tablet Take 5 mg by mouth daily.     apixaban  (ELIQUIS ) 2.5 MG TABS tablet Take 1 tablet (2.5 mg total) by mouth 2 (two) times daily. 56 tablet 0   carvedilol  (COREG ) 25 MG tablet Take 25 mg by mouth 2 (two) times daily.  3   fluticasone (FLONASE ALLERGY RELIEF) 50 MCG/ACT nasal spray Place 1 spray into both nostrils daily.     Iron , Ferrous Sulfate , 325 (65 Fe) MG TABS Take 1 tablet by mouth daily with breakfast. 30 tablet 1   No current facility-administered medications for this visit.    REVIEW OF SYSTEMS:   Constitutional: ( - ) fevers, ( - )  chills , ( - ) night sweats Eyes: ( - ) blurriness of vision, ( - ) double vision, ( - ) watery eyes Ears, nose, mouth, throat, and face: ( - ) mucositis, ( - )  sore throat Respiratory: ( - ) cough, ( - ) dyspnea, ( - ) wheezes Cardiovascular: ( - ) palpitation, ( - ) chest discomfort, ( - ) lower extremity swelling Gastrointestinal:  ( - ) nausea, ( - ) heartburn, ( - ) change in bowel habits Skin: ( - ) abnormal skin rashes Lymphatics: ( - ) new lymphadenopathy, ( - ) easy bruising Neurological: ( - ) numbness, ( - ) tingling, ( - ) new weaknesses Behavioral/Psych: ( - ) mood change, ( - )  new changes  All other systems were reviewed with the patient and are negative.  PHYSICAL EXAMINATION:  Vitals:   12/29/23 1303  BP: 137/77  Pulse: (!) 53  Resp: 18  Temp: 97.6 F (36.4 C)  SpO2: 99%   Filed Weights   12/29/23 1303  Weight: 142 lb 11.2 oz (64.7 kg)    GENERAL: well appearing elderly African-American female in NAD  SKIN: skin color, texture, turgor are normal, no rashes or significant lesions EYES: conjunctiva are pink and non-injected, sclera clear LUNGS: clear to auscultation and percussion with normal breathing effort HEART: regular rate & rhythm and no murmurs and no lower extremity edema Musculoskeletal: no cyanosis of digits and no clubbing  PSYCH: alert & oriented x 3, fluent speech NEURO: no focal motor/sensory deficits  LABORATORY DATA:  I have reviewed the data as listed    Latest Ref Rng & Units 12/29/2023    2:17 PM 12/22/2023   12:43 PM 12/01/2023   12:05 PM  CBC  WBC 4.0 - 10.5 K/uL 5.0     Hemoglobin 12.0 - 15.0 g/dL 89.8  89.8  9.6   Hematocrit 36.0 - 46.0 % 32.4     Platelets 150 - 400 K/uL 324          Latest Ref Rng & Units 12/29/2023    2:17 PM 06/11/2023    4:18 AM 06/10/2023    9:44 AM  CMP  Glucose 70 - 99 mg/dL 86  86  873   BUN 8 - 23 mg/dL 36  33  35   Creatinine 0.44 - 1.00 mg/dL 8.07  8.21  8.37   Sodium 135 - 145 mmol/L 136  133  134   Potassium 3.5 - 5.1 mmol/L 4.7  4.4  4.6   Chloride 98 - 111 mmol/L 100  100  101   CO2 22 - 32 mmol/L 30  25  24    Calcium  8.9 - 10.3 mg/dL 9.6  8.9  9.3    Total Protein 6.5 - 8.1 g/dL 9.3   8.6   Total Bilirubin 0.0 - 1.2 mg/dL 0.5   0.9   Alkaline Phos 38 - 126 U/L 71   66   AST 15 - 41 U/L 11   19   ALT 0 - 44 U/L 11   17      ASSESSMENT & PLAN Donia JINNY Pitt 83 y.o. female with medical history significant for CKD, HFrEF, HTN, and mitral regurg who presents for evaluation of abnormal SPEP.   After review of the labs, review of the records, and discussion with the patient the patients findings are most consistent with elevated gammaglobulin but no evidence of M protein.  At this time have a low suspicion of a monoclonal gammopathy, we will conduct a full workup to be sure.  Additionally the patient does have markedly elevated ferritin and I will reach out to nephrology to discuss IV iron  and Retacrit  treatment.  Monoclonal Gammopathies are a group of medical conditions defined by the presence of a monoclonal protein (an M protein) in the blood or urine. Monoclonal gammopathies include monoclonal gammopathy of unknown significance (MGUS), Monoclonal gammopathies of renal or neurological significance,  smoldering multiple myeloma (SMM), multiple myeloma (MM), AL amyloidosis, and Waldenstrom macroglobulinemia. The goal of the initial workup is to determine which monoclonal gammopathy a patient has. The workup consists of evaluating protein in the serum (with serum protein electrophoresis (SPEP) and serum free light chains) , evaluating protein in the urine (  UPEP), and evaluation of the skeleton (DG Bone Met Survey) to assure no lytic lesions. Baseline bloodwork includes CMP and CBC. If no CRAB criteria or high risk criteria are noted then the diagnosis is MGUS. MGUS must be followed with bloodwork periodically to assure it does not convert to multiple myeloma (occurs to approximately 1% of patients per year). If there are CRAB criteria or high risk features (such as elevated serum free light chain ratio (taking into account renal function), a non IgG M  protein, or M protein >1.5) then a bone marrow biopsy must be pursued.    # Elevated gamma globulins --today will order an SPEP, UPEP, SFLC and beta 2 microglobulin --additionally will collect new baseline CBC, CMP, and LDH --recommend a metastatic bone survey to assess for lytic lesions if concern for monoclonal gammopathy --will consider the need for a bone marrow biopsy pending the above results --RTC pending the results of the above studies.  # Elevated Ferritin -- Will reach out to nephrology regarding elevated ferritin levels.  Patient is receiving monthly IV iron  and every 2 weekly Retacrit .   Orders Placed This Encounter  Procedures   CBC with Differential (Cancer Center Only)    Standing Status:   Future    Number of Occurrences:   1    Expiration Date:   12/28/2024   CMP (Cancer Center only)    Standing Status:   Future    Number of Occurrences:   1    Expiration Date:   12/28/2024   Lactate dehydrogenase (LDH)    Standing Status:   Future    Number of Occurrences:   1    Expiration Date:   12/28/2024   Multiple Myeloma Panel (SPEP&IFE w/QIG)    Standing Status:   Future    Number of Occurrences:   1    Expiration Date:   12/28/2024   Kappa/lambda light chains    Standing Status:   Future    Number of Occurrences:   1    Expiration Date:   12/28/2024   Beta 2 microglobulin    Standing Status:   Future    Number of Occurrences:   1    Expiration Date:   12/28/2024   24-Hr Ur UPEP/UIFE/Light Chains/TP    Standing Status:   Future    Expiration Date:   12/28/2024    All questions were answered. The patient knows to call the clinic with any problems, questions or concerns.  A total of more than 60 minutes were spent on this encounter with face-to-face time and non-face-to-face time, including preparing to see the patient, ordering tests and/or medications, counseling the patient and coordination of care as outlined above.   Norleen IVAR Kidney, MD Department of  Hematology/Oncology [facility] at  [facility] Phone: [phone] Pager: [phone] Email: [email or web address]  12/30/2023 6:58 PM

## 2023-12-30 ENCOUNTER — Encounter (HOSPITAL_COMMUNITY): Payer: Self-pay

## 2023-12-30 DIAGNOSIS — Z801 Family history of malignant neoplasm of trachea, bronchus and lung: Secondary | ICD-10-CM | POA: Diagnosis not present

## 2023-12-30 DIAGNOSIS — Z87891 Personal history of nicotine dependence: Secondary | ICD-10-CM | POA: Diagnosis not present

## 2023-12-30 DIAGNOSIS — R7989 Other specified abnormal findings of blood chemistry: Secondary | ICD-10-CM | POA: Diagnosis not present

## 2023-12-30 DIAGNOSIS — R778 Other specified abnormalities of plasma proteins: Secondary | ICD-10-CM | POA: Diagnosis not present

## 2023-12-30 DIAGNOSIS — Z803 Family history of malignant neoplasm of breast: Secondary | ICD-10-CM | POA: Diagnosis not present

## 2023-12-30 DIAGNOSIS — Z800 Family history of malignant neoplasm of digestive organs: Secondary | ICD-10-CM | POA: Diagnosis not present

## 2023-12-30 LAB — KAPPA/LAMBDA LIGHT CHAINS
Kappa free light chain: 199.3 mg/L — ABNORMAL HIGH (ref 3.3–19.4)
Kappa, lambda light chain ratio: 1.62 (ref 0.26–1.65)
Lambda free light chains: 123.3 mg/L — ABNORMAL HIGH (ref 5.7–26.3)

## 2023-12-30 LAB — BETA 2 MICROGLOBULIN, SERUM: Beta-2 Microglobulin: 8.1 mg/L — ABNORMAL HIGH (ref 0.6–2.4)

## 2024-01-01 LAB — MULTIPLE MYELOMA PANEL, SERUM
Albumin SerPl Elph-Mcnc: 2.9 g/dL (ref 2.9–4.4)
Albumin/Glob SerPl: 0.6 — ABNORMAL LOW (ref 0.7–1.7)
Alpha 1: 0.4 g/dL (ref 0.0–0.4)
Alpha2 Glob SerPl Elph-Mcnc: 0.9 g/dL (ref 0.4–1.0)
B-Globulin SerPl Elph-Mcnc: 1.1 g/dL (ref 0.7–1.3)
Gamma Glob SerPl Elph-Mcnc: 3 g/dL — ABNORMAL HIGH (ref 0.4–1.8)
Globulin, Total: 5.4 g/dL — ABNORMAL HIGH (ref 2.2–3.9)
IgA: 476 mg/dL — ABNORMAL HIGH (ref 64–422)
IgG (Immunoglobin G), Serum: 3373 mg/dL — ABNORMAL HIGH (ref 586–1602)
IgM (Immunoglobulin M), Srm: 197 mg/dL (ref 26–217)
Total Protein ELP: 8.3 g/dL (ref 6.0–8.5)

## 2024-01-02 LAB — UPEP/UIFE/LIGHT CHAINS/TP, 24-HR UR
% BETA, Urine: 34.4 %
ALPHA 1 URINE: 3.1 %
Albumin, U: 12.4 %
Alpha 2, Urine: 12.9 %
Free Kappa Lt Chains,Ur: 127.19 mg/L — ABNORMAL HIGH (ref 1.17–86.46)
Free Kappa/Lambda Ratio: 6.77 (ref 1.83–14.26)
Free Lambda Lt Chains,Ur: 18.78 mg/L — ABNORMAL HIGH (ref 0.27–15.21)
GAMMA GLOBULIN URINE: 37.3 %
Total Protein, Urine-Ur/day: 304 mg/(24.h) — ABNORMAL HIGH (ref 30–150)
Total Protein, Urine: 17.9 mg/dL
Total Volume: 1700

## 2024-01-05 ENCOUNTER — Encounter (HOSPITAL_COMMUNITY)
Admission: RE | Admit: 2024-01-05 | Discharge: 2024-01-05 | Disposition: A | Discharge status: Home / Self Care | Source: Ambulatory Visit | Attending: Nephrology | Admitting: Nephrology

## 2024-01-05 VITALS — BP 115/66 | HR 53 | Temp 97.3°F | Resp 17

## 2024-01-05 DIAGNOSIS — N189 Chronic kidney disease, unspecified: Secondary | ICD-10-CM | POA: Insufficient documentation

## 2024-01-05 DIAGNOSIS — D631 Anemia in chronic kidney disease: Secondary | ICD-10-CM | POA: Insufficient documentation

## 2024-01-05 DIAGNOSIS — D638 Anemia in other chronic diseases classified elsewhere: Secondary | ICD-10-CM

## 2024-01-05 LAB — POCT HEMOGLOBIN-HEMACUE: Hemoglobin: 10 g/dL — ABNORMAL LOW (ref 12.0–15.0)

## 2024-01-05 MED ORDER — EPOETIN ALFA-EPBX 10000 UNIT/ML IJ SOLN
20000.0000 [IU] | INTRAMUSCULAR | Status: DC
  Administered 2024-01-05 (×2): 20000 [IU] via SUBCUTANEOUS

## 2024-01-05 MED ORDER — EPOETIN ALFA-EPBX 10000 UNIT/ML IJ SOLN
INTRAMUSCULAR | Status: AC
  Filled 2024-01-05: qty 2

## 2024-01-19 ENCOUNTER — Encounter (HOSPITAL_COMMUNITY)
Admission: RE | Admit: 2024-01-19 | Discharge: 2024-01-19 | Disposition: A | Discharge status: Home / Self Care | Source: Ambulatory Visit | Attending: Nephrology

## 2024-01-19 VITALS — BP 131/80 | HR 60 | Temp 97.0°F | Resp 16

## 2024-01-19 DIAGNOSIS — D631 Anemia in chronic kidney disease: Secondary | ICD-10-CM | POA: Diagnosis not present

## 2024-01-19 DIAGNOSIS — N189 Chronic kidney disease, unspecified: Secondary | ICD-10-CM | POA: Diagnosis not present

## 2024-01-19 DIAGNOSIS — D638 Anemia in other chronic diseases classified elsewhere: Secondary | ICD-10-CM

## 2024-01-19 LAB — POCT HEMOGLOBIN-HEMACUE: Hemoglobin: 10.3 g/dL — ABNORMAL LOW (ref 12.0–15.0)

## 2024-01-19 LAB — IRON AND TIBC
Iron: 38 ug/dL (ref 28–170)
Saturation Ratios: 18 % (ref 10.4–31.8)
TIBC: 207 ug/dL — ABNORMAL LOW (ref 250–450)
UIBC: 169 ug/dL

## 2024-01-19 LAB — FERRITIN: Ferritin: 704 ng/mL — ABNORMAL HIGH (ref 11–307)

## 2024-01-19 MED ORDER — EPOETIN ALFA-EPBX 10000 UNIT/ML IJ SOLN
INTRAMUSCULAR | Status: AC
  Filled 2024-01-19: qty 2

## 2024-01-19 MED ORDER — EPOETIN ALFA-EPBX 10000 UNIT/ML IJ SOLN
20000.0000 [IU] | INTRAMUSCULAR | Status: DC
  Administered 2024-01-19: 20000 [IU] via SUBCUTANEOUS

## 2024-01-30 ENCOUNTER — Telehealth: Payer: Self-pay | Admitting: Pharmacy Technician

## 2024-01-30 NOTE — Telephone Encounter (Signed)
 Auth Submission: NO AUTH NEEDED Site of care: Site of care: [facility] Payer: MEDICARE A/B Medication & CPT/J Code(s) submitted: RETACRIT  Q5106 Diagnosis Code:  Route of submission (phone, fax, portal):  Phone # Fax # Auth type: Buy/Bill HB Units/visits requested: 10,000U Q14D Reference number:  Approval from: 10/22/23 to 05/26/24

## 2024-02-02 ENCOUNTER — Inpatient Hospital Stay (HOSPITAL_COMMUNITY): Admission: RE | Admit: 2024-02-02 | Source: Ambulatory Visit

## 2024-02-03 ENCOUNTER — Other Ambulatory Visit (HOSPITAL_COMMUNITY): Payer: Self-pay | Admitting: Nephrology

## 2024-02-03 DIAGNOSIS — D631 Anemia in chronic kidney disease: Secondary | ICD-10-CM | POA: Insufficient documentation

## 2024-02-04 ENCOUNTER — Ambulatory Visit (HOSPITAL_COMMUNITY)
Admission: RE | Admit: 2024-02-04 | Discharge: 2024-02-04 | Disposition: A | Discharge status: Home / Self Care | Source: Ambulatory Visit | Attending: Nephrology | Admitting: Nephrology

## 2024-02-04 ENCOUNTER — Encounter (HOSPITAL_COMMUNITY): Payer: Self-pay | Admitting: Nephrology

## 2024-02-04 ENCOUNTER — Other Ambulatory Visit (HOSPITAL_COMMUNITY): Payer: Self-pay | Admitting: Pharmacy Technician

## 2024-02-04 VITALS — BP 128/77 | HR 50 | Temp 97.2°F | Resp 16

## 2024-02-04 DIAGNOSIS — N184 Chronic kidney disease, stage 4 (severe): Secondary | ICD-10-CM | POA: Diagnosis not present

## 2024-02-04 DIAGNOSIS — D631 Anemia in chronic kidney disease: Secondary | ICD-10-CM | POA: Diagnosis not present

## 2024-02-04 LAB — POCT HEMOGLOBIN-HEMACUE: Hemoglobin: 11.1 g/dL — ABNORMAL LOW (ref 12.0–15.0)

## 2024-02-04 MED ORDER — EPOETIN ALFA-EPBX 20000 UNIT/ML IJ SOLN
20000.0000 [IU] | Freq: Once | INTRAMUSCULAR | Status: AC
Start: 2024-02-04 — End: 2024-02-04
  Administered 2024-02-04: 20000 [IU] via SUBCUTANEOUS

## 2024-02-04 MED ORDER — EPOETIN ALFA-EPBX 20000 UNIT/ML IJ SOLN: 20000.0000 [IU] | Freq: Once | INTRAMUSCULAR | Status: DC

## 2024-02-04 MED ORDER — EPOETIN ALFA-EPBX 20000 UNIT/ML IJ SOLN
INTRAMUSCULAR | Status: AC
  Filled 2024-02-04: qty 1

## 2024-02-05 ENCOUNTER — Other Ambulatory Visit (HOSPITAL_COMMUNITY): Payer: Self-pay | Admitting: Pharmacy Technician

## 2024-02-16 ENCOUNTER — Encounter (HOSPITAL_COMMUNITY)

## 2024-02-16 ENCOUNTER — Ambulatory Visit (HOSPITAL_COMMUNITY)
Admission: RE | Admit: 2024-02-16 | Discharge: 2024-02-16 | Disposition: A | Discharge status: Home / Self Care | Source: Ambulatory Visit | Attending: Nephrology | Admitting: Nephrology

## 2024-02-16 VITALS — BP 130/74 | HR 67 | Temp 97.5°F | Resp 16

## 2024-02-16 DIAGNOSIS — D631 Anemia in chronic kidney disease: Secondary | ICD-10-CM | POA: Diagnosis not present

## 2024-02-16 DIAGNOSIS — N184 Chronic kidney disease, stage 4 (severe): Secondary | ICD-10-CM | POA: Diagnosis not present

## 2024-02-16 LAB — IRON AND TIBC
Iron: 42 ug/dL (ref 28–170)
Saturation Ratios: 20 % (ref 10.4–31.8)
TIBC: 213 ug/dL — ABNORMAL LOW (ref 250–450)
UIBC: 171 ug/dL

## 2024-02-16 LAB — POCT HEMOGLOBIN-HEMACUE: Hemoglobin: 11 g/dL — ABNORMAL LOW (ref 12.0–15.0)

## 2024-02-16 LAB — FERRITIN: Ferritin: 584 ng/mL — ABNORMAL HIGH (ref 11–307)

## 2024-02-16 MED ORDER — EPOETIN ALFA-EPBX 20000 UNIT/ML IJ SOLN
INTRAMUSCULAR | Status: AC
  Filled 2024-02-16: qty 1

## 2024-02-16 MED ORDER — EPOETIN ALFA-EPBX 20000 UNIT/ML IJ SOLN
20000.0000 [IU] | Freq: Once | INTRAMUSCULAR | Status: AC
  Administered 2024-02-16: 20000 [IU] via SUBCUTANEOUS

## 2024-02-18 ENCOUNTER — Encounter (HOSPITAL_COMMUNITY)

## 2024-02-23 ENCOUNTER — Encounter (HOSPITAL_COMMUNITY): Payer: Self-pay | Admitting: Nephrology

## 2024-03-01 ENCOUNTER — Inpatient Hospital Stay (HOSPITAL_COMMUNITY): Admission: RE | Admit: 2024-03-01 | Source: Ambulatory Visit

## 2024-03-08 ENCOUNTER — Ambulatory Visit (HOSPITAL_COMMUNITY)
Admission: RE | Admit: 2024-03-08 | Discharge: 2024-03-08 | Disposition: A | Discharge status: Home / Self Care | Source: Ambulatory Visit | Attending: Nephrology | Admitting: Nephrology

## 2024-03-08 VITALS — BP 135/73 | HR 55 | Temp 97.6°F | Resp 16

## 2024-03-08 DIAGNOSIS — D631 Anemia in chronic kidney disease: Secondary | ICD-10-CM | POA: Diagnosis not present

## 2024-03-08 DIAGNOSIS — N184 Chronic kidney disease, stage 4 (severe): Secondary | ICD-10-CM | POA: Insufficient documentation

## 2024-03-08 LAB — POCT HEMOGLOBIN-HEMACUE: Hemoglobin: 10.9 g/dL — ABNORMAL LOW (ref 12.0–15.0)

## 2024-03-08 MED ORDER — EPOETIN ALFA-EPBX 10000 UNIT/ML IJ SOLN
INTRAMUSCULAR | Status: AC
  Filled 2024-03-08: qty 1

## 2024-03-08 MED ORDER — EPOETIN ALFA-EPBX 10000 UNIT/ML IJ SOLN
10000.0000 [IU] | Freq: Once | INTRAMUSCULAR | Status: AC
  Administered 2024-03-08: 10000 [IU] via SUBCUTANEOUS

## 2024-03-15 ENCOUNTER — Encounter (HOSPITAL_COMMUNITY)

## 2024-03-22 ENCOUNTER — Ambulatory Visit (HOSPITAL_COMMUNITY)
Admission: RE | Admit: 2024-03-22 | Discharge: 2024-03-22 | Disposition: A | Discharge status: Home / Self Care | Source: Ambulatory Visit | Attending: Nephrology | Admitting: Nephrology

## 2024-03-22 ENCOUNTER — Encounter (HOSPITAL_COMMUNITY)

## 2024-03-22 VITALS — BP 137/72 | HR 54 | Temp 97.8°F | Resp 16

## 2024-03-22 DIAGNOSIS — D631 Anemia in chronic kidney disease: Secondary | ICD-10-CM | POA: Diagnosis not present

## 2024-03-22 DIAGNOSIS — N184 Chronic kidney disease, stage 4 (severe): Secondary | ICD-10-CM | POA: Diagnosis not present

## 2024-03-22 DIAGNOSIS — I129 Hypertensive chronic kidney disease with stage 1 through stage 4 chronic kidney disease, or unspecified chronic kidney disease: Secondary | ICD-10-CM | POA: Diagnosis not present

## 2024-03-22 DIAGNOSIS — N2581 Secondary hyperparathyroidism of renal origin: Secondary | ICD-10-CM | POA: Diagnosis not present

## 2024-03-22 LAB — POCT HEMOGLOBIN-HEMACUE: Hemoglobin: 10.7 g/dL — ABNORMAL LOW (ref 12.0–15.0)

## 2024-03-22 LAB — IRON AND TIBC
Iron: 51 ug/dL (ref 28–170)
Saturation Ratios: 23 % (ref 10.4–31.8)
TIBC: 224 ug/dL — ABNORMAL LOW (ref 250–450)
UIBC: 173 ug/dL

## 2024-03-22 LAB — FERRITIN: Ferritin: 650 ng/mL — ABNORMAL HIGH (ref 11–307)

## 2024-03-22 MED ORDER — EPOETIN ALFA-EPBX 10000 UNIT/ML IJ SOLN
10000.0000 [IU] | Freq: Once | INTRAMUSCULAR | Status: AC
  Administered 2024-03-22: 10000 [IU] via SUBCUTANEOUS

## 2024-03-22 MED ORDER — EPOETIN ALFA-EPBX 10000 UNIT/ML IJ SOLN
INTRAMUSCULAR | Status: AC
  Filled 2024-03-22: qty 1

## 2024-03-23 ENCOUNTER — Other Ambulatory Visit (HOSPITAL_COMMUNITY): Payer: Self-pay | Admitting: Pharmacy Technician

## 2024-03-29 ENCOUNTER — Telehealth: Payer: Self-pay | Admitting: Pharmacy Technician

## 2024-03-29 NOTE — Telephone Encounter (Addendum)
° °  Mailed pap 03/29/24

## 2024-04-05 ENCOUNTER — Encounter (HOSPITAL_COMMUNITY)

## 2024-04-19 ENCOUNTER — Ambulatory Visit (HOSPITAL_COMMUNITY)
Admission: RE | Admit: 2024-04-19 | Discharge: 2024-04-19 | Disposition: A | Discharge status: Home / Self Care | Source: Ambulatory Visit | Attending: Nephrology | Admitting: Nephrology

## 2024-04-19 ENCOUNTER — Telehealth: Payer: Self-pay | Admitting: Physician Assistant

## 2024-04-19 VITALS — BP 129/71 | HR 55 | Temp 97.8°F | Resp 16

## 2024-04-19 DIAGNOSIS — N184 Chronic kidney disease, stage 4 (severe): Secondary | ICD-10-CM | POA: Diagnosis not present

## 2024-04-19 DIAGNOSIS — D631 Anemia in chronic kidney disease: Secondary | ICD-10-CM | POA: Insufficient documentation

## 2024-04-19 LAB — RENAL FUNCTION PANEL
Albumin: 3.2 g/dL — ABNORMAL LOW (ref 3.5–5.0)
Anion gap: 11 (ref 5–15)
BUN: 35 mg/dL — ABNORMAL HIGH (ref 8–23)
CO2: 26 mmol/L (ref 22–32)
Calcium: 9.2 mg/dL (ref 8.9–10.3)
Chloride: 102 mmol/L (ref 98–111)
Creatinine, Ser: 2.17 mg/dL — ABNORMAL HIGH (ref 0.44–1.00)
GFR, Estimated: 22 mL/min — ABNORMAL LOW (ref >60)
Glucose, Bld: 108 mg/dL — ABNORMAL HIGH (ref 70–99)
Phosphorus: 4 mg/dL (ref 2.5–4.6)
Potassium: 4.3 mmol/L (ref 3.5–5.1)
Sodium: 139 mmol/L (ref 135–145)

## 2024-04-19 LAB — POCT HEMOGLOBIN-HEMACUE: Hemoglobin: 11.3 g/dL — ABNORMAL LOW (ref 12.0–15.0)

## 2024-04-19 MED ORDER — EPOETIN ALFA-EPBX 20000 UNIT/ML IJ SOLN
20000.0000 [IU] | Freq: Once | INTRAMUSCULAR | Status: AC
  Administered 2024-04-19: 20000 [IU] via SUBCUTANEOUS

## 2024-04-19 MED ORDER — EPOETIN ALFA-EPBX 20000 UNIT/ML IJ SOLN
INTRAMUSCULAR | Status: AC
  Filled 2024-04-19: qty 1

## 2024-04-19 NOTE — Telephone Encounter (Signed)
 Paper Work Dropped Off: Patient Assistance  Date: 04-19-24  Location of paper:  [employer] mailbox

## 2024-04-20 LAB — PTH, INTACT AND CALCIUM
Calcium, Total (PTH): 9.7 mg/dL (ref 8.7–10.3)
PTH: 120 pg/mL — ABNORMAL HIGH (ref 15–65)

## 2024-04-26 NOTE — Telephone Encounter (Signed)
 Faxed application to [employer]

## 2024-05-06 ENCOUNTER — Other Ambulatory Visit (HOSPITAL_COMMUNITY): Payer: Self-pay

## 2024-05-06 NOTE — Telephone Encounter (Signed)
 Caller [name]) stated they will need further information to complete patient's assistance for her Eliquis  medication.   Patient Account# [id number].

## 2024-05-06 NOTE — Telephone Encounter (Addendum)
 [employer] has new rules for 2026. The patient has part a/b insurance so she will have to meet 3% before they will approve her assistance. She should qualify for medicare assistance. I called and left her a message   [facility] [employer] [address]  [place] [address]. [phone]  She needs to call [facility] and say I need help applying for the low income subsidy/extra help program.   If that doesn't work, the cheapest is pradaxa at 56.66 at [facility] on goodrx or warfarin for 4.00 at [employer]

## 2024-05-06 NOTE — Telephone Encounter (Addendum)
 Called [employer] and they needed collaborating md   But also granddaughter to call lis/extra help

## 2024-05-17 ENCOUNTER — Ambulatory Visit (HOSPITAL_COMMUNITY)
Admission: RE | Admit: 2024-05-17 | Discharge: 2024-05-17 | Disposition: A | Discharge status: Home / Self Care | Source: Ambulatory Visit | Attending: Nephrology | Admitting: Nephrology

## 2024-05-17 VITALS — BP 138/74 | HR 48 | Temp 97.6°F | Resp 16

## 2024-05-17 DIAGNOSIS — N184 Chronic kidney disease, stage 4 (severe): Secondary | ICD-10-CM | POA: Insufficient documentation

## 2024-05-17 DIAGNOSIS — D631 Anemia in chronic kidney disease: Secondary | ICD-10-CM | POA: Diagnosis present

## 2024-05-17 LAB — POCT HEMOGLOBIN-HEMACUE: Hemoglobin: 11.7 g/dL — ABNORMAL LOW (ref 12.0–15.0)

## 2024-05-17 LAB — IRON AND TIBC
Iron: 64 ug/dL (ref 28–170)
Saturation Ratios: 28 % (ref 10.4–31.8)
TIBC: 231 ug/dL — ABNORMAL LOW (ref 250–450)
UIBC: 167 ug/dL

## 2024-05-17 LAB — FERRITIN: Ferritin: 1066 ng/mL — ABNORMAL HIGH (ref 11–307)

## 2024-05-17 MED ORDER — EPOETIN ALFA-EPBX 20000 UNIT/ML IJ SOLN
20000.0000 [IU] | Freq: Once | INTRAMUSCULAR | Status: AC
  Administered 2024-05-17: 20000 [IU] via SUBCUTANEOUS

## 2024-05-17 MED ORDER — EPOETIN ALFA-EPBX 20000 UNIT/ML IJ SOLN
INTRAMUSCULAR | Status: AC
  Filled 2024-05-17: qty 1

## 2024-06-14 ENCOUNTER — Inpatient Hospital Stay (HOSPITAL_COMMUNITY): Admission: RE | Admit: 2024-06-14 | Source: Ambulatory Visit

## 2024-06-21 ENCOUNTER — Encounter (HOSPITAL_COMMUNITY)

## 2024-06-28 ENCOUNTER — Inpatient Hospital Stay (HOSPITAL_COMMUNITY): Admission: RE | Admit: 2024-06-28 | Source: Ambulatory Visit

## 2024-07-05 ENCOUNTER — Ambulatory Visit: Admitting: Physician Assistant

## 2024-07-05 ENCOUNTER — Inpatient Hospital Stay (HOSPITAL_COMMUNITY): Admission: RE | Admit: 2024-07-05

## 2024-07-12 ENCOUNTER — Encounter (HOSPITAL_COMMUNITY)

## 2024-07-19 ENCOUNTER — Encounter (HOSPITAL_COMMUNITY)

## 2024-07-26 ENCOUNTER — Encounter (HOSPITAL_COMMUNITY)

## 2024-08-02 ENCOUNTER — Encounter (HOSPITAL_COMMUNITY)
